# Patient Record
Sex: Male | Born: 1980 | Race: Black or African American | Hispanic: No | Marital: Married | Smoking: Former smoker
Health system: Southern US, Community
[De-identification: ages and names within clinical notes are randomized; demographics above are authoritative.]

## PROBLEM LIST (undated history)

## (undated) DIAGNOSIS — Z973 Presence of spectacles and contact lenses: Secondary | ICD-10-CM

## (undated) DIAGNOSIS — G4733 Obstructive sleep apnea (adult) (pediatric): Secondary | ICD-10-CM

## (undated) DIAGNOSIS — I471 Supraventricular tachycardia: Secondary | ICD-10-CM

## (undated) DIAGNOSIS — S83206A Unspecified tear of unspecified meniscus, current injury, right knee, initial encounter: Secondary | ICD-10-CM

## (undated) HISTORY — DX: Supraventricular tachycardia: I47.1

## (undated) HISTORY — PX: SUPRAVENTRICULAR TACHYCARDIA ABLATION: SHX6106

---

## 2007-01-28 ENCOUNTER — Emergency Department (HOSPITAL_COMMUNITY): Admission: EM | Admit: 2007-01-28 | Discharge: 2007-01-28 | Payer: Self-pay | Admitting: Emergency Medicine

## 2008-02-10 ENCOUNTER — Emergency Department (HOSPITAL_COMMUNITY): Admission: EM | Admit: 2008-02-10 | Discharge: 2008-02-11 | Payer: Self-pay | Admitting: Emergency Medicine

## 2008-09-13 ENCOUNTER — Emergency Department (HOSPITAL_COMMUNITY): Admission: EM | Admit: 2008-09-13 | Discharge: 2008-09-13 | Payer: Self-pay | Admitting: Emergency Medicine

## 2009-06-09 ENCOUNTER — Observation Stay (HOSPITAL_COMMUNITY): Admission: EM | Admit: 2009-06-09 | Discharge: 2009-06-09 | Payer: Self-pay | Admitting: Emergency Medicine

## 2010-02-10 ENCOUNTER — Emergency Department (HOSPITAL_COMMUNITY): Admission: EM | Admit: 2010-02-10 | Discharge: 2010-02-10 | Payer: Self-pay | Admitting: Family Medicine

## 2012-07-11 ENCOUNTER — Ambulatory Visit: Payer: Self-pay

## 2012-07-11 ENCOUNTER — Ambulatory Visit: Payer: BC Managed Care – PPO

## 2012-10-07 ENCOUNTER — Emergency Department (HOSPITAL_COMMUNITY)
Admission: EM | Admit: 2012-10-07 | Discharge: 2012-10-07 | Disposition: A | Discharge status: Home / Self Care | Payer: Managed Care, Other (non HMO) | Attending: Emergency Medicine | Admitting: Emergency Medicine

## 2012-10-07 ENCOUNTER — Emergency Department (HOSPITAL_COMMUNITY): Payer: Managed Care, Other (non HMO)

## 2012-10-07 ENCOUNTER — Encounter (HOSPITAL_COMMUNITY): Payer: Self-pay | Admitting: Emergency Medicine

## 2012-10-07 DIAGNOSIS — M1990 Unspecified osteoarthritis, unspecified site: Secondary | ICD-10-CM

## 2012-10-07 DIAGNOSIS — M47817 Spondylosis without myelopathy or radiculopathy, lumbosacral region: Secondary | ICD-10-CM | POA: Insufficient documentation

## 2012-10-07 DIAGNOSIS — G8929 Other chronic pain: Secondary | ICD-10-CM | POA: Insufficient documentation

## 2012-10-07 MED ORDER — PREDNISONE 10 MG PO TABS: 20.0000 mg | ORAL_TABLET | Freq: Every day | ORAL | Status: DC

## 2012-10-07 MED ORDER — HYDROMORPHONE HCL PF 1 MG/ML IJ SOLN
1.0000 mg | Freq: Once | INTRAMUSCULAR | Status: AC
  Administered 2012-10-07: 1 mg via INTRAVENOUS
  Filled 2012-10-07: qty 1

## 2012-10-07 MED ORDER — OXYCODONE-ACETAMINOPHEN 5-325 MG PO TABS: 2.0000 | ORAL_TABLET | ORAL | Status: DC | PRN

## 2012-10-07 MED ORDER — METHYLPREDNISOLONE SODIUM SUCC 125 MG IJ SOLR
125.0000 mg | Freq: Once | INTRAMUSCULAR | Status: AC
  Administered 2012-10-07: 125 mg via INTRAVENOUS
  Filled 2012-10-07: qty 2

## 2012-10-07 MED ORDER — DIAZEPAM 5 MG PO TABS
5.0000 mg | ORAL_TABLET | Freq: Once | ORAL | Status: AC
  Administered 2012-10-07: 5 mg via ORAL
  Filled 2012-10-07: qty 1

## 2012-10-07 MED ORDER — DIAZEPAM 5 MG PO TABS: 10.0000 mg | ORAL_TABLET | Freq: Four times a day (QID) | ORAL | Status: DC | PRN

## 2012-10-07 MED ORDER — SODIUM CHLORIDE 0.9 % IV SOLN
INTRAVENOUS | Status: DC
  Administered 2012-10-07: 13:00:00 via INTRAVENOUS

## 2012-10-07 MED ORDER — SODIUM CHLORIDE 0.9 % IV BOLUS (SEPSIS)
1000.0000 mL | Freq: Once | INTRAVENOUS | Status: AC
  Administered 2012-10-07: 1000 mL via INTRAVENOUS

## 2012-10-07 MED ORDER — HYDROMORPHONE HCL PF 1 MG/ML IJ SOLN
2.0000 mg | Freq: Once | INTRAMUSCULAR | Status: AC
  Administered 2012-10-07: 2 mg via INTRAVENOUS
  Filled 2012-10-07: qty 2

## 2012-10-07 NOTE — ED Provider Notes (Signed)
History     CSN: [id number]  Arrival date & time 10/07/12  0820   First MD Initiated Contact with Patient 10/07/12 [phone]      No chief complaint on file.   (Consider location/radiation/quality/duration/timing/severity/associated sxs/prior treatment) The history is provided by the patient.   Patient here with worsening severe lower back pain since last night after he bent over to pick up an object. Complains of sharp burning pain starts in the bones back into sinus left leg. Notes that when he was walking in here today he was dragging his left foot. Denies any loss of bowel or bladder function. History of chronic back pain the past. Has used over-the-counter medications without relief. No prior history of back surgery but has been diagnosed with degenerative joint disease No past medical history on file.  No past surgical history on file.  No family history on file.  History  Substance Use Topics  . Smoking status: Not on file  . Smokeless tobacco: Not on file  . Alcohol Use: Not on file      Review of Systems  All other systems reviewed and are negative.    Allergies  Review of patient's allergies indicates not on file.  Home Medications  No current outpatient prescriptions on file.  There were no vitals taken for this visit.  Physical Exam  Nursing note and vitals reviewed. Constitutional: He is oriented to person, place, and time. He appears well-developed and well-nourished.  Non-toxic appearance. No distress.  HENT:  Head: Normocephalic and atraumatic.  Eyes: Conjunctivae normal, EOM and lids are normal. Pupils are equal, round, and reactive to light.  Neck: Normal range of motion. Neck supple. No tracheal deviation present. No mass present.  Cardiovascular: Normal rate, regular rhythm and normal heart sounds.  Exam reveals no gallop.   No murmur heard. Pulmonary/Chest: Effort normal and breath sounds normal. No stridor. No respiratory distress. He has no  decreased breath sounds. He has no wheezes. He has no rhonchi. He has no rales.  Abdominal: Soft. Normal appearance and bowel sounds are normal. He exhibits no distension. There is no tenderness. There is no rebound and no CVA tenderness.  Musculoskeletal: Normal range of motion. He exhibits no edema and no tenderness.       Arms: Neurological: He is alert and oriented to person, place, and time. No cranial nerve deficit or sensory deficit. GCS eye subscore is 4. GCS verbal subscore is 5. GCS motor subscore is 6.       Patient unable to cooperate fully with straight leg raise due to pain. His patellar reflexes were 2+. Sensation was intact.  Skin: Skin is warm and dry. No abrasion and no rash noted.  Psychiatric: He has a normal mood and affect. His speech is normal and behavior is normal.    ED Course  Procedures (including critical care time)  Labs Reviewed - No data to display No results found.   No diagnosis found.    MDM  Patient's MRI results noted and without signs of acute surgical emergency. He was given indications here for pain and muscle spasms feels better. Will be prescribed for steroids, opiates, muscle accidents. Neurological exam is stable at time of discharge       Toy Baker, MD 10/07/12 1335

## 2012-10-07 NOTE — ED Notes (Signed)
Pt presenting to ed with c/o low back pain onset x 1 day pt denies injury at this time

## 2012-10-07 NOTE — ED Notes (Signed)
Patient transported to MRI 

## 2012-10-07 NOTE — ED Notes (Signed)
MD at bedside. 

## 2013-08-29 ENCOUNTER — Emergency Department (INDEPENDENT_AMBULATORY_CARE_PROVIDER_SITE_OTHER)
Admission: EM | Admit: 2013-08-29 | Discharge: 2013-08-29 | Disposition: A | Discharge status: Home / Self Care | Payer: Managed Care, Other (non HMO) | Source: Home / Self Care | Attending: Family Medicine | Admitting: Family Medicine

## 2013-08-29 ENCOUNTER — Encounter (HOSPITAL_COMMUNITY): Payer: Self-pay | Admitting: Emergency Medicine

## 2013-08-29 DIAGNOSIS — J020 Streptococcal pharyngitis: Secondary | ICD-10-CM

## 2013-08-29 MED ORDER — LIDOCAINE HCL (PF) 1 % IJ SOLN
INTRAMUSCULAR | Status: AC
  Filled 2013-08-29: qty 5

## 2013-08-29 MED ORDER — CEFDINIR 300 MG PO CAPS: 300.0000 mg | ORAL_CAPSULE | Freq: Two times a day (BID) | ORAL | Status: DC

## 2013-08-29 MED ORDER — CEFTRIAXONE SODIUM 1 G IJ SOLR
INTRAMUSCULAR | Status: AC
  Filled 2013-08-29: qty 10

## 2013-08-29 MED ORDER — CEFTRIAXONE SODIUM 1 G IJ SOLR
1.0000 g | Freq: Once | INTRAMUSCULAR | Status: AC
  Administered 2013-08-29: 1 g via INTRAMUSCULAR

## 2013-08-29 NOTE — ED Provider Notes (Signed)
CSN: [id number]     Arrival date & time 08/29/13  1923 History   First MD Initiated Contact with Patient 08/29/13 1936     Chief Complaint  Patient presents with  . Sore Throat   (Consider location/radiation/quality/duration/timing/severity/associated sxs/prior Treatment) Patient is a 32 y.o. male presenting with pharyngitis. The history is provided by the patient.  Sore Throat This is a new problem. The current episode started 2 days ago. The problem has been gradually worsening. Pertinent negatives include no chest pain and no abdominal pain. The symptoms are aggravated by swallowing.    History reviewed. No pertinent past medical history. History reviewed. No pertinent past surgical history. History reviewed. No pertinent family history. History  Substance Use Topics  . Smoking status: Current Some Day Smoker  . Smokeless tobacco: Not on file  . Alcohol Use: Yes     Comment: socially    Review of Systems  Constitutional: Positive for fever and appetite change.  HENT: Positive for sore throat. Negative for congestion, postnasal drip and rhinorrhea.   Respiratory: Negative for cough.   Cardiovascular: Negative for chest pain.  Gastrointestinal: Negative.  Negative for abdominal pain.  Skin: Negative.     Allergies  Review of patient's allergies indicates no known allergies.  Home Medications   Current Outpatient Rx  Name  Route  Sig  Dispense  Refill  . cefdinir (OMNICEF) 300 MG capsule   Oral   Take 1 capsule (300 mg total) by mouth 2 (two) times daily.   20 capsule   0   . diazepam (VALIUM) 5 MG tablet   Oral   Take 2 tablets (10 mg total) by mouth every 6 (six) hours as needed (muscle spasm).   20 tablet   0   . oxyCODONE-acetaminophen (PERCOCET/ROXICET) 5-325 MG per tablet   Oral   Take 2 tablets by mouth every 4 (four) hours as needed for pain.   20 tablet   0   . predniSONE (DELTASONE) 10 MG tablet   Oral   Take 2 tablets (20 mg total) by mouth  daily.   10 tablet   0    There were no vitals taken for this visit. Physical Exam  Nursing note and vitals reviewed. Constitutional: He is oriented to person, place, and time. He appears well-developed and well-nourished.  HENT:  Head: Normocephalic.  Right Ear: External ear normal.  Left Ear: External ear normal.  Mouth/Throat: Uvula is midline and mucous membranes are normal. Oropharyngeal exudate and posterior oropharyngeal erythema present.  Neck: Normal range of motion. Neck supple.  Cardiovascular: Regular rhythm.   Pulmonary/Chest: Breath sounds normal.  Lymphadenopathy:    He has cervical adenopathy.  Neurological: He is alert and oriented to person, place, and time.  Skin: Skin is warm and dry.    ED Course  Procedures (including critical care time) Labs Review Labs Reviewed  POCT RAPID STREP A ([facility] ONLY) - Abnormal; Notable for the following:    Streptococcus, Group A Screen (Direct) POSITIVE (*)    All other components within normal limits   Imaging Review No results found.  EKG Interpretation    Date/Time:    Ventricular Rate:    PR Interval:    QRS Duration:   QT Interval:    QTC Calculation:   R Axis:     Text Interpretation:              MDM  Strep pos.    Linna Hoff, MD 08/29/13  2022 

## 2013-08-29 NOTE — ED Notes (Signed)
C/o sore throat and swollen tonsils x 3 days.  No relief with otc throat spray.  Denies fever, n/v/d

## 2013-10-15 ENCOUNTER — Emergency Department (HOSPITAL_COMMUNITY)
Admission: EM | Admit: 2013-10-15 | Discharge: 2013-10-15 | Disposition: A | Discharge status: Home / Self Care | Payer: Managed Care, Other (non HMO) | Source: Home / Self Care | Attending: Emergency Medicine | Admitting: Emergency Medicine

## 2013-10-15 ENCOUNTER — Encounter (HOSPITAL_COMMUNITY): Payer: Self-pay | Admitting: Emergency Medicine

## 2013-10-15 DIAGNOSIS — H109 Unspecified conjunctivitis: Secondary | ICD-10-CM

## 2013-10-15 MED ORDER — POLYMYXIN B-TRIMETHOPRIM 10000-0.1 UNIT/ML-% OP SOLN: 1.0000 [drp] | OPHTHALMIC | Status: DC

## 2013-10-15 NOTE — Discharge Instructions (Signed)

## 2013-10-15 NOTE — ED Provider Notes (Signed)
  Chief Complaint   Chief Complaint  Patient presents with  . Eye Problem    History of Present Illness   [name] [name] is a 33 year old male who has had a one-day history of redness of the right. He denies any pain. It feels slightly irritated. There is no purulent drainage, there is copious tearing. His vision has been normal. He wears contact lenses and has them and right now. He denies any trauma to the eye or foreign body.  Review of Systems   Other than as noted above, the patient denies any of the following symptoms: Systemic:  No fever, chills, sweats, fatigue, or weight loss. Eye:  No redness, eye pain, photophobia, discharge, blurred vision, or diplopia. ENT:  No nasal congestion, rhinorrhea, or sore throat. Lymphatic:  No adenopathy. Skin:  No rash or pruritis.  PMFSH   Past medical history, family history, social history, meds, and allergies were reviewed.    Physical Examination    Vital signs:  BP 139/87  Pulse 81  Temp(Src) 99.6 F (37.6 C) (Oral)  Resp 16  SpO2 98%  Visual Acuity:  Right Eye Distance: 50 Left Eye Distance: 30 Bilateral Distance: 20  General:  Alert and in no distress. Eye:  Lids were normal. Conjunctiva was moderately injected. There was no purulent drainage. No foreign body. Cornea was intact to fluorescein staining. Anterior chamber was normal. PERRLA, full EOMs, fundi were benign. ENT:  TMs and canals clear.  Nasal mucosa normal.  No intra-oral lesions, mucous membranes moist, pharynx clear. Neck:  No adenopathy tenderness or mass. Skin:  Clear, warm and dry.  Assessment   The encounter diagnosis was Conjunctivitis.  Plan     1.  Meds:  The following meds were prescribed:   Discharge Medication List as of 10/15/2013  9:36 AM    START taking these medications   Details  trimethoprim-polymyxin b (POLYTRIM) ophthalmic solution Place 1 drop into the right eye every 4 (four) hours., Starting 10/15/2013, Until Discontinued, Normal        2.  Patient Education/Counseling:  The patient was given appropriate handouts, self care instructions, and instructed in symptomatic relief.  Advised moist warm compresses, avoidance of rubbing, and suggested he leave contacts out.  3.  Follow up:  The patient was told to follow up here if no better in 3 to 4 days, or sooner if becoming worse in any way, and given some red flag symptoms such as increasing pain or changes in vision which would prompt immediate return.  Follow up here as needed.      Reuben Likesavid C Helio Lack, MD 10/15/13 [phone]

## 2013-10-15 NOTE — ED Notes (Signed)
Pt  Reports  He  Woke    Up   With      A  Red      Irritated  r  Eye           He  denys  Any  Injury  He  denys  Any pain    He       denys  Any known  FB   In the  Affected  Eye

## 2013-10-18 ENCOUNTER — Telehealth (HOSPITAL_COMMUNITY): Payer: Self-pay | Admitting: Emergency Medicine

## 2013-10-18 NOTE — ED Notes (Signed)
Patient calls in today stating that his eyes are no better with Polytrim eyedrops. He's been on them now for 3 days. I called back phone number that he gave us: [phone]; he was not available on that number but I did leave a message with him. I told him that if his eye was still giving him problems, that the best thing for him to do would be to see an eye doctor. I gave him the name of Dr. Stephannie LiJason Sanders to call tomorrow morning. If he has any further problems he can call us back here.  Reuben Likesavid C Andrell Tallman, MD 10/18/13 [phone]

## 2013-10-27 ENCOUNTER — Telehealth (HOSPITAL_COMMUNITY): Payer: Self-pay | Admitting: *Deleted

## 2013-10-27 NOTE — ED Notes (Signed)
2/11 Pt. called on VM and said he has pink eye and was given a Rx.  States his eye is still red.  I can see a note that Dr. Lorenz CoasterKeller called him back on 2/11 and left him instructions on his VM to follow-up with opthamologist. 2/20 I called and left a message to see it pt. got the message and followed up. [name] [name], [name] [name] 10/27/2013

## 2014-06-21 ENCOUNTER — Emergency Department (INDEPENDENT_AMBULATORY_CARE_PROVIDER_SITE_OTHER)
Admission: EM | Admit: 2014-06-21 | Discharge: 2014-06-21 | Disposition: A | Discharge status: Home / Self Care | Payer: Managed Care, Other (non HMO) | Source: Home / Self Care | Attending: Family Medicine | Admitting: Family Medicine

## 2014-06-21 ENCOUNTER — Encounter (HOSPITAL_COMMUNITY): Payer: Self-pay | Admitting: Emergency Medicine

## 2014-06-21 DIAGNOSIS — K4090 Unilateral inguinal hernia, without obstruction or gangrene, not specified as recurrent: Secondary | ICD-10-CM

## 2014-06-21 NOTE — ED Provider Notes (Signed)
CSN: [id number]     Arrival date & time 06/21/14  0840 History   First MD Initiated Contact with Patient 06/21/14 [phone]     Chief Complaint  Patient presents with  . Hernia   (Consider location/radiation/quality/duration/timing/severity/associated sxs/prior Treatment) HPI   33 year old male presents complaining of possible inguinal hernia. For several months he has a bulge in his groin that is worse with any lifting. He is able to push the bulge back in. When this bulge is out, he has pain, but the pain goes away after he pushes it back in. No constipation, bowel movements are normal. No abdominal pain right now. He thinks it is a hernia and he is requesting referral to general surgery.  History reviewed. No pertinent past medical history. History reviewed. No pertinent past surgical history. History reviewed. No pertinent family history. History  Substance Use Topics  . Smoking status: Current Some Day Smoker  . Smokeless tobacco: Not on file  . Alcohol Use: Yes     Comment: socially    Review of Systems  Gastrointestinal: Positive for abdominal pain (see history of present illness).  All other systems reviewed and are negative.   Allergies  Review of patient's allergies indicates no known allergies.  Home Medications   Prior to Admission medications   Medication Sig Start Date End Date Taking? Authorizing Provider  cefdinir (OMNICEF) 300 MG capsule Take 1 capsule (300 mg total) by mouth 2 (two) times daily. 08/29/13   Linna HoffJames D Kindl, MD  diazepam (VALIUM) 5 MG tablet Take 2 tablets (10 mg total) by mouth every 6 (six) hours as needed (muscle spasm). 10/07/12   Toy BakerAnthony T Allen, MD  oxyCODONE-acetaminophen (PERCOCET/ROXICET) 5-325 MG per tablet Take 2 tablets by mouth every 4 (four) hours as needed for pain. 10/07/12   Toy BakerAnthony T Allen, MD  predniSONE (DELTASONE) 10 MG tablet Take 2 tablets (20 mg total) by mouth daily. 10/07/12   Toy BakerAnthony T Allen, MD  trimethoprim-polymyxin b  (POLYTRIM) ophthalmic solution Place 1 drop into the right eye every 4 (four) hours. 10/15/13   Reuben Likesavid C Keller, MD   BP 163/90  Pulse 75  Temp(Src) 98.7 F (37.1 C) (Oral)  Resp 16  SpO2 97% Physical Exam  Nursing note and vitals reviewed. Constitutional: He is oriented to person, place, and time. He appears well-developed and well-nourished. No distress.  HENT:  Head: Normocephalic.  Pulmonary/Chest: Effort normal. No respiratory distress.  Abdominal: Soft. Bowel sounds are normal. He exhibits no distension and no mass. There is no tenderness. There is no rebound and no guarding. A hernia is present. Hernia confirmed positive in the left inguinal area (Direct, easily reducible, nontender).  Neurological: He is alert and oriented to person, place, and time. Coordination normal.  Skin: Skin is warm and dry. No rash noted. He is not diaphoretic.  Psychiatric: He has a normal mood and affect. Judgment normal.    ED Course  Procedures (including critical care time) Labs Review Labs Reviewed - No data to display  Imaging Review No results found.   MDM   1. Direct inguinal hernia    Given that he has frequent pain from this hernia, he would likely benefit from repair. Referred to general surgery. Given  ER return precautions.    Graylon GoodZachary H Gaspard Isbell, PA-C 06/21/14 1014

## 2014-06-21 NOTE — ED Notes (Signed)
Pt  Reports  Pain   LLQ     X     sev    Months     Worse  Last  Pm    denys  Any  Recent      Injury  No  Vomiting          -

## 2014-06-21 NOTE — Discharge Instructions (Signed)

## 2014-07-07 NOTE — ED Provider Notes (Signed)
Medical screening examination/treatment/procedure(s) were performed by a resident physician or non-physician practitioner and as the supervising physician I was immediately available for consultation/collaboration.  Kveon Casanas, MD Family Medicine   Allyne Hebert J Destine Ambroise, MD 07/07/14 0344 

## 2015-10-22 ENCOUNTER — Emergency Department (HOSPITAL_COMMUNITY)
Admission: EM | Admit: 2015-10-22 | Discharge: 2015-10-22 | Disposition: A | Discharge status: Home / Self Care | Payer: Managed Care, Other (non HMO) | Attending: Emergency Medicine | Admitting: Emergency Medicine

## 2015-10-22 ENCOUNTER — Encounter (HOSPITAL_COMMUNITY): Payer: Self-pay

## 2015-10-22 DIAGNOSIS — Z87891 Personal history of nicotine dependence: Secondary | ICD-10-CM | POA: Diagnosis not present

## 2015-10-22 DIAGNOSIS — I471 Supraventricular tachycardia: Secondary | ICD-10-CM | POA: Insufficient documentation

## 2015-10-22 DIAGNOSIS — R002 Palpitations: Secondary | ICD-10-CM | POA: Diagnosis present

## 2015-10-22 MED ORDER — METOPROLOL TARTRATE 25 MG PO TABS
25.0000 mg | ORAL_TABLET | Freq: Once | ORAL | Status: AC
  Administered 2015-10-22: 25 mg via ORAL
  Filled 2015-10-22: qty 1

## 2015-10-22 MED ORDER — METOPROLOL TARTRATE 25 MG PO TABS: 25.0000 mg | ORAL_TABLET | Freq: Two times a day (BID) | ORAL | Status: DC

## 2015-10-22 NOTE — ED Notes (Signed)
Pt arrives EMS from work with C/o rapid heart rate 180 SVT with  then 12 mg of Adenosine. Convert to NSR.

## 2015-10-22 NOTE — Discharge Instructions (Signed)
Paroxysmal Supraventricular Tachycardia Paroxysmal supraventricular tachycardia (PSVT) is a type of abnormal heart rhythm. It causes your heart to beat very quickly and then suddenly stop beating so quickly. A normal heart rate is 60-100 beats per minute. During an episode of PSVT, your heart rate may be 150-250 beats per minute. This can make you feel light-headed and short of breath. An episode of PSVT can be frightening. It is usually not dangerous. The heart has four chambers. All chambers need to work together for the heart to beat effectively. A normal heartbeat usually starts in the right upper chamber of the heart (atrium) when an area (sinoatrial node) puts out an electrical signal that spreads to the other chambers. People with PSVT may have abnormal electrical pathways, or they may have other areas in the upper chambers that send out electrical signals. The result is a very rapid heartbeat. When your heart beats very quickly, it does not have time to fill completely with blood. When PSVT happens often or it lasts for long periods, it can lead to heart weakness and failure. Most people with PSVT do not have any other heart disease. CAUSES Abnormal electrical activity in the heart causes PSVT. It is not known why some people get PSVT and others do not. RISK FACTORS You may be more likely to have PSVT if:  You are 20-30 years old.  You are a woman. Other factors that may increase your chances of an attack include:  Stress.  Being tired.  Smoking.  Stimulant drugs.  Alcoholic drinks.  Caffeine.  Pregnancy. SIGNS AND SYMPTOMS A mild episode of PSVT may cause no symptoms. If you do have signs and symptoms, they may include:  A pounding heart.  Feeling of skipped heartbeats (palpitations).  Weakness.  Shortness of breath.  Tightness or pain in your chest.  Light-headedness.  Anxiety.  Dizziness.  Sweating.  Nausea.  A fainting spell. DIAGNOSIS Your health care  provider may suspect PSVT if you have symptoms that come and go. The health care provider will do a physical exam. If you are having an episode during the exam, the health care provider may be able to diagnose PSVT by listening to your heart and feeling your pulse. Tests may also be done, including:  An electrical study of your heart (electrocardiogram, or ECG).  A test in which you wear a portable ECG monitor all day (Holter monitor) or for several days (event monitor).  A test that involves taking an image of your heart using sound waves (echocardiogram) to rule out other causes of a fast heart rate. TREATMENT You may not need treatment if episodes of PSVT do not happen often or if they do not cause symptoms. If PSVT episodes do cause symptoms, your health care provider may first suggest trying a self-treatment called vagus nerve stimulation. The vagus nerve extends down from the brain. It regulates certain body functions. Stimulating this nerve can slow down the heart. Your health care provider can teach you ways to do this. You may need to try a few ways to find what works best for you. Options include:  Holding your breath and pushing, as though you are having a bowel movement.  Massaging an area on one side of your neck below your jaw.  Bending forward with your head between your legs.  Bending forward with your head between your legs and coughing.  Massaging your eyeballs with your eyes closed. If vagus nerve stimulation does not work, other treatment options include:    Medicines to prevent an attack.  Being treated in the hospital with medicine or electric shock to stop an attack (cardioversion). This treatment can include:  Getting medicine through an IV line.  Having a small electric shock delivered to your heart. You will be given medicine to make you sleep through this procedure.  If you have frequent episodes with symptoms, you may need a procedure to get rid of the faulty  areas of your heart (radiofrequency ablation) and end the episodes of PSVT. In this procedure:  A long, thin tube (catheter) is passed through one of your veins into your heart.  Energy directed through the catheter eliminates the areas of your heart that are causing abnormal electric stimulation. HOME CARE INSTRUCTIONS  Take medicines only as directed by your health care provider.  Do not use caffeine in any form if caffeine triggers episodes of PSVT. Otherwise, consume caffeine in moderation. This means no more than a few cups of coffee or the equivalent each day.  Do not drink alcohol if alcohol triggers episodes of PSVT. Otherwise, limit alcohol intake to no more than 1 drink per day for nonpregnant women and 2 drinks per day for men. One drink equals 12 ounces of beer, 5 ounces of wine, or 1 ounces of hard liquor.  Do not use any tobacco products, including cigarettes, chewing tobacco, or electronic cigarettes. If you need help quitting, ask your health care provider.  Try to get at least 7 hours of sleep each night.  Find healthy ways to manage stress.  Perform vagus nerve stimulation as directed by your health care provider.  Maintain a healthy weight.  Get some exercise on most days. Ask your health care provider to suggest some good activities for you. SEEK MEDICAL CARE IF:  You are having episodes of PSVT more often, or they are lasting longer.  Vagus nerve stimulation is no longer helping.  You have new symptoms during an episode. SEEK IMMEDIATE MEDICAL CARE IF:  You have chest pain or trouble breathing.  You have an episode of PSVT that has lasted longer than 20 minutes.  You have passed out from an episode of PSVT. These symptoms may represent a serious problem that is an emergency. Do not wait to see if the symptoms will go away. Get medical help right away. Call your local emergency services (911 in the [place] Do not drive yourself to the hospital.   This  information is not intended to replace advice given to you by your health care provider. Make sure you discuss any questions you have with your health care provider.   Document Released: 08/24/2005 Document Revised: 09/14/2014 Document Reviewed: 02/01/2014 Elsevier Interactive Patient Education 2016 [employer]  

## 2015-10-22 NOTE — ED Provider Notes (Signed)
CSN: [id number]     Arrival date & time 10/22/15  0908 History   First MD Initiated Contact with Patient 10/22/15 0913     Chief Complaint  Patient presents with  . Palpitations     HPI Patient presents to the emergency department via EMS.  Noted to have acute onset palpitations this morning.  Patient has a history of SVT.  He attempted to Valsalva to break himself.  He denies caffeine and chocolate intake.  He denies energy drinks.  He denies over-the-counter cough and cold medicine.  He denies illicit drug use.  Specifically denies cocaine.  He was given 6 mg of adenosine followed by 12 mg of adenosine with conversion to sinus rhythm.  Patient is asymptomatic at this time.  He states he was in his normal state of health earlier today.  Past Medical History  Diagnosis Date  . SVT (supraventricular tachycardia) (HCC)    History reviewed. No pertinent past surgical history. History reviewed. No pertinent family history. Social History  Substance Use Topics  . Smoking status: Former Games developer  . Smokeless tobacco: None  . Alcohol Use: No     Comment: socially    Review of Systems  All other systems reviewed and are negative.     Allergies  Review of patient's allergies indicates no known allergies.  Home Medications   Prior to Admission medications   Medication Sig Start Date End Date Taking? Authorizing Provider  metoprolol tartrate (LOPRESSOR) 25 MG tablet Take 1 tablet (25 mg total) by mouth 2 (two) times daily. 10/22/15   Azalia Bilis, MD   Pulse 85  Temp(Src) 98.4 F (36.9 C) (Oral)  Resp 16  Ht  (1.854 m)  Wt 275 lb (124.739 kg)  BMI 36.29 kg/m2  SpO2 98% Physical Exam  Constitutional: He is oriented to person, place, and time. He appears well-developed and well-nourished.  HENT:  Head: Normocephalic and atraumatic.  Eyes: EOM are normal.  Neck: Normal range of motion.  Cardiovascular: Normal rate, regular rhythm, normal heart sounds and intact distal  pulses.   Pulmonary/Chest: Effort normal and breath sounds normal. No respiratory distress.  Abdominal: Soft. He exhibits no distension. There is no tenderness.  Musculoskeletal: Normal range of motion.  Neurological: He is alert and oriented to person, place, and time.  Skin: Skin is warm and dry.  Psychiatric: He has a normal mood and affect. Judgment normal.  Nursing note and vitals reviewed.   ED Course  Procedures (including critical care time) Labs Review Labs Reviewed - No data to display  Imaging Review No results found. I have personally reviewed and evaluated these images and lab results as part of my medical decision-making.   EKG Interpretation   Date/Time:  Tuesday October 22 2015 09:17:12 EST Ventricular Rate:  86 PR Interval:  215 QRS Duration: 90 QT Interval:  346 QTC Calculation: 414 R Axis:   85 Text Interpretation:  Sinus rhythm Prolonged PR interval No old tracing to  compare Confirmed by Loghan Subia  MD, Caryn Bee ([id number]) on 10/22/2015 9:52:13 AM      MDM   Final diagnoses:  Palpitations  SVT (supraventricular tachycardia) (HCC)    Patient asymptomatic at this time.  Patient in normal sinus rhythm.  Patient be started on metoprolol.  Primary care and cardiology follow-up.  He understands return to the ER for new or worsening symptoms.    Azalia Bilis, MD 10/22/15 [phone]

## 2015-10-22 NOTE — ED Notes (Signed)
Pt Verbalizes understanding of instructions,

## 2016-01-01 DIAGNOSIS — I471 Supraventricular tachycardia, unspecified: Secondary | ICD-10-CM

## 2016-01-01 HISTORY — DX: Supraventricular tachycardia, unspecified: I47.10

## 2016-01-01 HISTORY — PX: CARDIAC ELECTROPHYSIOLOGY MAPPING AND ABLATION: SHX1292

## 2016-01-01 HISTORY — DX: Supraventricular tachycardia: I47.1

## 2016-03-12 ENCOUNTER — Ambulatory Visit (INDEPENDENT_AMBULATORY_CARE_PROVIDER_SITE_OTHER): Payer: Managed Care, Other (non HMO)

## 2016-03-12 ENCOUNTER — Ambulatory Visit (INDEPENDENT_AMBULATORY_CARE_PROVIDER_SITE_OTHER): Payer: Managed Care, Other (non HMO) | Admitting: Physician Assistant

## 2016-03-12 ENCOUNTER — Encounter: Payer: Self-pay | Admitting: Physician Assistant

## 2016-03-12 VITALS — BP 130/84 | HR 78 | Temp 99.1°F | Resp 16 | Ht 71.25 in | Wt 289.2 lb

## 2016-03-12 DIAGNOSIS — M25512 Pain in left shoulder: Secondary | ICD-10-CM

## 2016-03-12 DIAGNOSIS — K4090 Unilateral inguinal hernia, without obstruction or gangrene, not specified as recurrent: Secondary | ICD-10-CM | POA: Insufficient documentation

## 2016-03-12 DIAGNOSIS — Z6841 Body Mass Index (BMI) 40.0 and over, adult: Secondary | ICD-10-CM | POA: Insufficient documentation

## 2016-03-12 MED ORDER — MELOXICAM 15 MG PO TABS
15.0000 mg | ORAL_TABLET | Freq: Every day | ORAL | Status: DC
Start: 2016-03-12 — End: 2019-05-16

## 2016-03-12 NOTE — Progress Notes (Signed)
Patient ID: [name] [name], male     DOB: Jun 19, 1981, 35 y.o.    MRN: [id number]  PCP: No PCP Per Patient  Chief Complaint  Patient presents with  . right shoulder pain    x 2 wks    Subjective:    HPI  Presents for evaluation of RIGHT shoulder pain x 2 weeks.  Pain began without a specific inciting event. Worse with movement. Notes a "pop" sensation or sound when he rotates the arm. Pain with lifting and grasping. Unable to raise his arm to 90 degrees, due to the pain. "My whole arm feels funny when I get the pain." Denies tingling, chest pain, SOB, neck pain. No previous shoulder injury.   Prior to Admission medications   Medication Sig Start Date End Date Taking? Authorizing Provider  metoprolol tartrate (LOPRESSOR) 25 MG tablet Take 1 tablet (25 mg total) by mouth 2 (two) times daily. Patient not taking: Reported on 03/12/2016 10/22/15   Azalia BilisKevin Campos, MD     No Known Allergies   Patient Active Problem List   Diagnosis Date Noted  . Left inguinal hernia 03/12/2016  . BMI 40.0-44.9, adult (HCC) 03/12/2016     Family History  Problem Relation Age of Onset  . Diabetes Mother   . Diabetes Father   . Diabetes Brother      Social History   Social History  . Marital Status: Married    Spouse Name: [name] [name]  . Number of Children: 3  . Years of Education: 12+   Occupational History  . Paint Mixer     [name]   Social History Main Topics  . Smoking status: Former Games developermoker  . Smokeless tobacco: Never Used  . Alcohol Use: 0.6 oz/week    1 Standard drinks or equivalent per week     Comment: rarely  . Drug Use: No  . Sexual Activity:    Partners: Female   Other Topics Concern  . Not on file   Social History Narrative   Lives with his wife and their 3 daughters.           Review of Systems As above.      Objective:  Physical Exam  Constitutional: He is oriented to person, place, and time. He appears well-developed and  well-nourished. He is active and cooperative. No distress.  BP 130/84 mmHg  Pulse 78  Temp(Src) 99.1 F (37.3 C) (Oral)  Resp 16  Ht 5' 11.25" (1.81 m)  Wt 289 lb 3.2 oz (131.18 kg)  BMI 40.04 kg/m2  SpO2 97%   Eyes: Conjunctivae are normal.  Pulmonary/Chest: Effort normal.  Musculoskeletal:       Right shoulder: He exhibits decreased range of motion, tenderness (supraspinatus, subscapularis, infraspinatus) and pain. He exhibits no bony tenderness, no swelling, no effusion, no crepitus, no deformity, no laceration, no spasm, normal pulse and normal strength.       Left shoulder: Normal.       Right elbow: Normal.      Right wrist: Normal.  POSITIVE empty can sign  Neurological: He is alert and oriented to person, place, and time. No cranial nerve deficit or sensory deficit.  Strength of the RIGHT arm is limited by pain in the shoulder with testing maneuvers.  Psychiatric: He has a normal mood and affect. His speech is normal and behavior is normal.         Dg Shoulder Right  03/12/2016  CLINICAL DATA:  Shoulder pain for 2 weeks, exacerbated  by movement. EXAM: RIGHT SHOULDER - 2+ VIEW COMPARISON:  None. FINDINGS: There is no evidence of fracture or dislocation. There is no evidence of arthropathy or other focal bone abnormality. Soft tissues are unremarkable. IMPRESSION: Negative. Electronically Signed   By: Myles RosenthalJohn  [name] M.D.   On: 03/12/2016 17:25       Assessment & Plan:  1. Left shoulder pain Normal radiographs. Meloxicam and rest. If no significant improvement on 03/16/2016, contact me. I will plan to refer to orthopedics for additional evaluation and treatment of presumed rotator cuff injury. - DG Shoulder Right; Future - meloxicam (MOBIC) 15 MG tablet; Take 1 tablet (15 mg total) by mouth daily.  Dispense: 30 tablet; Refill: 1   [name] Brashelle S. Frances Ambrosino, PA-C Physician Assistant-Certified [facility]

## 2016-03-12 NOTE — Patient Instructions (Addendum)
   If you are not doing better by Monday call us and we will refer you to Ortho. Schedule a wellness visit with Chelle at your convince to bring the health wellness items up to date and assess your hernia.  IF you received an x-ray today, you will receive an invoice from [facility]. Please contact [facility] at [phone] with questions or concerns regarding your invoice.   IF you received labwork today, you will receive an invoice from [facility]. Please contact [facility] at [phone] with questions or concerns regarding your invoice.   Our billing staff will not be able to assist you with questions regarding bills from these companies.  You will be contacted with the lab results as soon as they are available. The fastest way to get your results is to activate your My Chart account. Instructions are located on the last page of this paperwork. If you have not heard from us regarding the results in 2 weeks, please contact this office.

## 2016-03-12 NOTE — Progress Notes (Signed)
Subjective:    Patient ID: [name] [name], male    DOB: 1981/08/31, 35 y.o.   MRN: [id number]  Chief Complaint  Patient presents with  . right shoulder pain    x 2 wks    HPI  Patient is a 35yo male who presents today with shoulder pain x 2 weeks.  Pain hurts at rest, exacerbated by movement.  States he can feel a pop with rotation and abduction.  Having difficultly lifting things with that arm due to pain.  States "My whole arm feels funny when I get the pain."  Has used ibuprofen, which relieves pain for about 3 hours, then it returns.  Denies numbness, tingling, SOB, trauma or inticing event; chest, neck or back pain; or similar shoulder pain previously.  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  Patient Active Problem List   Diagnosis Date Noted  . Left inguinal hernia 03/12/2016  . BMI 40.0-44.9, adult (HCC) 03/12/2016   Current Outpatient Prescriptions on File Prior to Visit  Medication Sig Dispense Refill  . metoprolol tartrate (LOPRESSOR) 25 MG tablet Take 1 tablet (25 mg total) by mouth 2 (two) times daily. (Patient not taking: Reported on 03/12/2016) 60 tablet 1   No current facility-administered medications on file prior to visit.   No Known Allergies    Objective:   Physical Exam  Constitutional: He is oriented to person, place, and time. He appears well-developed and well-nourished. No distress.  Neck: Normal range of motion. Neck supple. No tracheal deviation present. No thyromegaly present.  Cardiovascular: Normal rate, regular rhythm, normal heart sounds and intact distal pulses.   Pulses:      Radial pulses are 2+ on the right side, and 2+ on the left side.       Posterior tibial pulses are 2+ on the right side, and 2+ on the left side.  Pulmonary/Chest: Effort normal and breath sounds normal. No respiratory distress. He has no rales.    Musculoskeletal:       Right shoulder: He exhibits decreased range of motion, tenderness and pain. He exhibits no swelling, no effusion, no crepitus, no deformity, no laceration, no spasm and normal pulse.  Tenderness to palpation at coracoid process and posterior glenohumeral joint.   + empty can test Pain with passive and active ROM.    Lymphadenopathy:    He has no cervical adenopathy.  Neurological: He is alert and oriented to person, place, and time.  Skin: He is not diaphoretic.  Psychiatric: He has a normal mood and affect. His behavior is normal. Judgment and thought content normal.    Dg Shoulder Right  03/12/2016  CLINICAL DATA:  Shoulder pain for 2 weeks, exacerbated by movement. EXAM: RIGHT SHOULDER - 2+ VIEW COMPARISON:  None. FINDINGS: There is no evidence of fracture or dislocation. There is no evidence of arthropathy or other focal bone abnormality. Soft tissues are unremarkable. IMPRESSION: Negative. Electronically Signed   By: Myles RosenthalJohn  Stahl M.D.   On: 03/12/2016 17:25        Assessment & Plan:  1. Left shoulder pain - DG Shoulder Right; Future - meloxicam (MOBIC) 15 MG tablet; Take 1 tablet (15 mg total) by mouth daily.  Dispense: 30 tablet; Refill: 1  Imaging showed no acute findings, he likely has a rotator cuff injury.   Prescribed meloxicam to be taken daily to help decrease pain.  Instructed patient to take with food and not to take ibuprofen or naproxen with this medication.   Patient to contact office on Monday if pain has not improved with treatment and a referral to Ortho will be placed.  Recommended patient schedule a wellness visit with his PCP when able to bring the health wellness items up to date and assess his possible hernia.  Pranit Owensby P. Elora Wolter, PA-S

## 2016-03-13 ENCOUNTER — Telehealth: Payer: Self-pay

## 2016-03-13 DIAGNOSIS — M25512 Pain in left shoulder: Secondary | ICD-10-CM

## 2016-03-13 NOTE — Telephone Encounter (Signed)
Pt is stating that he shoulder pain and would like for chelle to go ahead and refer him to ortho like they talked about  Best number  [phone]

## 2016-03-13 NOTE — Telephone Encounter (Signed)
Referral for ortho placed.

## 2016-03-26 ENCOUNTER — Encounter: Payer: Self-pay | Admitting: Physician Assistant

## 2016-03-26 DIAGNOSIS — M25511 Pain in right shoulder: Secondary | ICD-10-CM | POA: Insufficient documentation

## 2016-03-30 ENCOUNTER — Other Ambulatory Visit: Payer: Self-pay | Admitting: Orthopedic Surgery

## 2016-03-30 DIAGNOSIS — M25511 Pain in right shoulder: Secondary | ICD-10-CM

## 2016-04-05 ENCOUNTER — Ambulatory Visit
Admission: RE | Admit: 2016-04-05 | Discharge: 2016-04-05 | Disposition: A | Discharge status: Home / Self Care | Payer: Managed Care, Other (non HMO) | Source: Ambulatory Visit | Attending: Orthopedic Surgery | Admitting: Orthopedic Surgery

## 2016-04-05 DIAGNOSIS — M25511 Pain in right shoulder: Secondary | ICD-10-CM

## 2016-04-14 MED FILL — OXYCODONE/APAP 5/325MG: 5-325 | 10 days supply | Qty: 60 | Fill #0

## 2016-04-15 HISTORY — PX: SHOULDER SURGERY: SHX246

## 2016-04-15 HISTORY — PX: SHOULDER ARTHROSCOPY W/ LABRAL REPAIR: SHX2399

## 2016-09-15 ENCOUNTER — Encounter: Payer: Self-pay | Admitting: Physician Assistant

## 2016-09-15 ENCOUNTER — Ambulatory Visit (INDEPENDENT_AMBULATORY_CARE_PROVIDER_SITE_OTHER): Payer: Managed Care, Other (non HMO) | Admitting: Physician Assistant

## 2016-09-15 VITALS — BP 150/92 | HR 100 | Temp 99.6°F | Resp 16 | Ht 71.5 in | Wt 296.0 lb

## 2016-09-15 DIAGNOSIS — R000 Tachycardia, unspecified: Secondary | ICD-10-CM

## 2016-09-15 DIAGNOSIS — Z833 Family history of diabetes mellitus: Secondary | ICD-10-CM

## 2016-09-15 DIAGNOSIS — Z0000 Encounter for general adult medical examination without abnormal findings: Secondary | ICD-10-CM | POA: Diagnosis not present

## 2016-09-15 DIAGNOSIS — K4090 Unilateral inguinal hernia, without obstruction or gangrene, not specified as recurrent: Secondary | ICD-10-CM

## 2016-09-15 DIAGNOSIS — Z6841 Body Mass Index (BMI) 40.0 and over, adult: Secondary | ICD-10-CM

## 2016-09-15 DIAGNOSIS — G4730 Sleep apnea, unspecified: Secondary | ICD-10-CM

## 2016-09-15 DIAGNOSIS — Z23 Encounter for immunization: Secondary | ICD-10-CM

## 2016-09-15 LAB — POCT URINALYSIS DIP (MANUAL ENTRY)
BILIRUBIN UA: NEGATIVE
GLUCOSE UA: NEGATIVE
Ketones, POC UA: NEGATIVE
LEUKOCYTES UA: NEGATIVE
NITRITE UA: NEGATIVE
Protein Ur, POC: NEGATIVE
Spec Grav, UA: >=1.03
UROBILINOGEN UA: 0.2
pH, UA: 5.5

## 2016-09-15 NOTE — Progress Notes (Signed)
PCP: Porfirio Oar, PA-C  Subjective:    Patient ID: [name] [name], male    DOB: 02-08-1981, 36 y.o.   MRN: [id number]  HPI Presents for Annual Wellness exam. He is accompanied by his wife, also my patient, as is his mother-in-law and her wife.  He has a large inguinal hernia, soft, easily reducible. He would like referral to surgery for repair. He notes that it bulges more easily now that in the past. He has some pain when it protrudes, but it always resolves with reduction.  S/P ablation for PSVT. Doesn't like the way the metoprolol made him feel. "Loopy. Like I was dragging. Like I had no energy."  Reports loud snoring. His wife notes some pauses in breathing. Daytime fatigue despite going to bed early most nights.   Colorectal Cancer Screening: not yet a candidate Prostate Cancer Screening: not yet. No family history of prostate cancer. Bone Density Testing: not yet a candidate. HIV Screening: not yet. STI Screening: very low risk. Seasonal Influenza Vaccination: declined Td/Tdap Vaccination: today Pneumococcal Vaccination: not yet a candidate Zoster Vaccination: not yet a candidate Frequency of Dental evaluation: Q6 months Frequency of Eye evaluation: annually   Prior to Admission medications   Medication Sig Start Date End Date Taking? Authorizing Provider  meloxicam (MOBIC) 15 MG tablet Take 1 tablet (15 mg total) by mouth daily. Patient not taking: Reported on 09/15/2016 03/12/16  NO Garlan Drewes, PA-C  metoprolol tartrate (LOPRESSOR) 25 MG tablet Take 1 tablet (25 mg total) by mouth 2 (two) times daily. Patient not taking: Reported on 09/15/2016 10/22/15  NO Azalia Bilis, MD    No Known Allergies   Review of Systems  Constitutional: Positive for fatigue (and daytime somnolence).  HENT: Negative.   Eyes: Negative.   Respiratory: Negative.   Cardiovascular: Negative.   Gastrointestinal: Negative.   Endocrine: Negative.   Genitourinary: Positive for scrotal  swelling (LEFT inguinal hernia is worsening).  Musculoskeletal: Negative.   Skin: Negative.   Allergic/Immunologic: Positive for environmental allergies.  Neurological: Negative.   Hematological: Negative.   Psychiatric/Behavioral: Positive for sleep disturbance (snores, sleeps with neck hyerextended, possible apnea episodes).   Patient Active Problem List   Diagnosis Date Noted  . Right anterior shoulder pain 03/26/2016  . Left inguinal hernia 03/12/2016  . BMI 40.0-44.9, adult (HCC) 03/12/2016    Past Surgical History:  Procedure Laterality Date  . SHOULDER SURGERY      Family History  Problem Relation Age of Onset  . Diabetes Mother   . Diabetes Father   . Diabetes Brother    Social History   Social History  . Marital status: Married    Spouse name: Laine Giovanetti  . Number of children: 3  . Years of education: 12+   Occupational History  . Paint Mixer     [name]   Social History Main Topics  . Smoking status: Former Games developer  . Smokeless tobacco: Never Used  . Alcohol use 0.6 oz/week    1 Standard drinks or equivalent per week     Comment: rarely  . Drug use: No  . Sexual activity: Yes    Partners: Female   Other Topics Concern  . Not on file   Social History Narrative   Lives with his wife and their 3 daughters.          Objective:   Physical Exam  Constitutional: He is oriented to person, place, and time. He appears well-developed and well-nourished. He is active and  cooperative.  Non-toxic appearance. He does not have a sickly appearance. He does not appear ill. No distress.  BP (!) 150/92 (BP Location: Left Arm, Cuff Size: Large)   Pulse 100   Temp 99.6 F (37.6 C) (Oral)   Resp 16   Ht 5' 11.5" (1.816 m)   Wt 296 lb (134.3 kg)   SpO2 96%   BMI 40.71 kg/m    HENT:  Head: Normocephalic and atraumatic.  Right Ear: Hearing, tympanic membrane, external ear and ear canal normal.  Left Ear: Hearing, tympanic membrane, external ear  and ear canal normal.  Nose: Nose normal.  Mouth/Throat: Uvula is midline, oropharynx is clear and moist and mucous membranes are normal. He does not have dentures. No oral lesions. No trismus in the jaw. Normal dentition. No dental abscesses, uvula swelling, lacerations or dental caries.  Eyes: Conjunctivae, EOM and lids are normal. Pupils are equal, round, and reactive to light. Right eye exhibits no discharge. Left eye exhibits no discharge. No scleral icterus.  Fundoscopic exam:      The right eye shows no arteriolar narrowing, no AV nicking, no exudate, no hemorrhage and no papilledema.       The left eye shows no arteriolar narrowing, no AV nicking, no exudate, no hemorrhage and no papilledema.  Neck: Normal range of motion, full passive range of motion without pain and phonation normal. Neck supple. No spinous process tenderness and no muscular tenderness present. No neck rigidity. No tracheal deviation, no edema, no erythema and normal range of motion present. No thyromegaly present.  Cardiovascular: Normal rate, regular rhythm, S1 normal, S2 normal, normal heart sounds, intact distal pulses and normal pulses.  Exam reveals no gallop and no friction rub.   No murmur heard. Pulmonary/Chest: Effort normal and breath sounds normal. No respiratory distress. He has no wheezes. He has no rales.  Abdominal: Soft. Normal appearance and bowel sounds are normal. He exhibits no distension and no mass. There is no hepatosplenomegaly. There is no tenderness. There is no rebound and no guarding. No hernia.  Musculoskeletal: Normal range of motion. He exhibits no edema or tenderness.       Cervical back: Normal. He exhibits normal range of motion, no tenderness, no bony tenderness, no swelling, no edema, no deformity, no laceration, no pain, no spasm and normal pulse.       Thoracic back: Normal.       Lumbar back: Normal.  Lymphadenopathy:       Head (right side): No submental, no submandibular, no  tonsillar, no preauricular, no posterior auricular and no occipital adenopathy present.       Head (left side): No submental, no submandibular, no tonsillar, no preauricular, no posterior auricular and no occipital adenopathy present.    He has no cervical adenopathy.       Right: No supraclavicular adenopathy present.       Left: No supraclavicular adenopathy present.  Neurological: He is alert and oriented to person, place, and time. He has normal strength and normal reflexes. He displays no tremor. No cranial nerve deficit. He exhibits normal muscle tone. Coordination and gait normal.  Skin: Skin is warm, dry and intact. No abrasion, no ecchymosis, no laceration, no lesion and no rash noted. He is not diaphoretic. No cyanosis or erythema. No pallor. Nails show no clubbing.  Psychiatric: He has a normal mood and affect. His speech is normal and behavior is normal. Judgment and thought content normal. Cognition and memory are normal.  Assessment & Plan:  1. Annual physical exam Age appropriate anticipatory guidance provided.  2. Need for Tdap vaccination - Tdap vaccine greater than or equal to 7yo IM  3. Left inguinal hernia Refer to general surgery at his convenience. He declines referral now, stating that he needs to save up time to take off from work. Counseled that if he has pain that does not resolve with reduction, or if he is unable to reduce it, he needs re-evaluation.  4. BMI 40.0-44.9, adult (HCC) - Comprehensive metabolic panel - Lipid panel  5. Tachycardia And uncontrolled BP. Await lab results. Proceed with sleep study.  - CBC with Differential/Platelet - Comprehensive metabolic panel - TSH - POCT urinalysis dipstick - Urine Microscopic  6. Sleep apnea, unspecified type - Ambulatory referral to Sleep Studies  7. Family history of diabetes mellitus - Hemoglobin A1c   Fernande Bras, PA-C Physician Assistant-Certified [facility]

## 2016-09-15 NOTE — Patient Instructions (Addendum)
IF you received an x-ray today, you will receive an invoice from [facility]. Please contact [facility] at [phone] with questions or concerns regarding your invoice.   IF you received labwork today, you will receive an invoice from [facility]. Please contact [facility] at [phone] with questions or concerns regarding your invoice.   Our billing staff will not be able to assist you with questions regarding bills from these companies.  You will be contacted with the lab results as soon as they are available. The fastest way to get your results is to activate your My Chart account. Instructions are located on the last page of this paperwork. If you have not heard from [place] regarding the results in 2 weeks, please contact this office.    Keeping you healthy  Get these tests  Blood pressure- Have your blood pressure checked once a year by your healthcare provider.  Normal blood pressure is 120/80.  Weight- Have your body mass index (BMI) calculated to screen for obesity.  BMI is a measure of body fat based on height and weight. You can also calculate your own BMI at GravelBags.it.  Cholesterol- Have your cholesterol checked regularly starting at age 21, sooner may be necessary if you have diabetes, high blood pressure, if a family member developed heart diseases at an early age or if you smoke.   Chlamydia, HIV, and other sexual transmitted disease- Get screened each year until the age of 65 then within three months of each new sexual partner.  Diabetes- Have your blood sugar checked regularly if you have high blood pressure, high cholesterol, a family history of diabetes or if you are overweight.  Get these vaccines  Flu shot- Every fall.  Tetanus shot- Every 10 years.  Menactra- Single dose; prevents meningitis.  Take these steps  Don't smoke- If you do smoke, ask your healthcare provider about quitting. For tips on how to quit, go to  www.smokefree.gov or call 1-800-QUIT-NOW.  Be physically active- Exercise 5 days a week for at least 30 minutes.  If you are not already physically active start slow and gradually work up to 30 minutes of moderate physical activity.  Examples of moderate activity include walking briskly, mowing the yard, dancing, swimming bicycling, etc.  Eat a healthy diet- Eat a variety of healthy foods such as fruits, vegetables, low fat milk, low fat cheese, yogurt, lean meats, poultry, fish, beans, tofu, etc.  For more information on healthy eating, go to www.thenutritionsource.org  Drink alcohol in moderation- Limit alcohol intake two drinks or less a day.  Never drink and drive.  Dentist- Brush and floss teeth twice daily; visit your dentis twice a year.  Depression-Your emotional health is as important as your physical health.  If you're feeling down, losing interest in things you normally enjoy please talk with your healthcare provider.  Gun Safety- If you keep a gun in your home, keep it unloaded and with the safety lock on.  Bullets should be stored separately.  Helmet use- Always wear a helmet when riding a motorcycle, bicycle, rollerblading or skateboarding.  Safe sex- If you may be exposed to a sexually transmitted infection, use a condom  Seat belts- Seat bels can save your life; always wear one.  Smoke/Carbon Monoxide detectors- These detectors need to be installed on the appropriate level of your home.  Replace batteries at least once a year.  Skin Cancer- When out in the sun, cover up and use sunscreen SPF 15 or higher.  Violence- If anyone is threatening or hurting you, please tell your healthcare provider.    Tdap Vaccine (Tetanus, Diphtheria and Pertussis): What You Need to Know 1. Why get vaccinated? Tetanus, diphtheria and pertussis are very serious diseases. Tdap vaccine can protect [place] from these diseases. And, Tdap vaccine given to pregnant women can protect newborn babies  against pertussis. TETANUS (Lockjaw) is rare in the [place] today. It causes painful muscle tightening and stiffness, usually all over the body.  It can lead to tightening of muscles in the head and neck so you can't open your mouth, swallow, or sometimes even breathe. Tetanus kills about 1 out of 10 people who are infected even after receiving the best medical care. DIPHTHERIA is also rare in the [place] today. It can cause a thick coating to form in the back of the throat.  It can lead to breathing problems, heart failure, paralysis, and death. PERTUSSIS (Whooping Cough) causes severe coughing spells, which can cause difficulty breathing, vomiting and disturbed sleep.  It can also lead to weight loss, incontinence, and rib fractures. Up to 2 in 100 adolescents and 5 in 100 adults with pertussis are hospitalized or have complications, which could include pneumonia or death. These diseases are caused by bacteria. Diphtheria and pertussis are spread from person to person through secretions from coughing or sneezing. Tetanus enters the body through cuts, scratches, or wounds. Before vaccines, as many as 200,000 cases of diphtheria, 200,000 cases of pertussis, and hundreds of cases of tetanus, were reported in the [place] each year. Since vaccination began, reports of cases for tetanus and diphtheria have dropped by about 99% and for pertussis by about 80%. 2. Tdap vaccine Tdap vaccine can protect adolescents and adults from tetanus, diphtheria, and pertussis. One dose of Tdap is routinely given at age 55 or 66. People who did not get Tdap at that age should get it as soon as possible. Tdap is especially important for healthcare professionals and anyone having close contact with a baby younger than 12 months. Pregnant women should get a dose of Tdap during every pregnancy, to protect the newborn from pertussis. Infants are most at risk for severe, life-threatening complications from  pertussis. Another vaccine, called Td, protects against tetanus and diphtheria, but not pertussis. A Td booster should be given every 10 years. Tdap may be given as one of these boosters if you have never gotten Tdap before. Tdap may also be given after a severe cut or burn to prevent tetanus infection. Your doctor or the person giving you the vaccine can give you more information. Tdap may safely be given at the same time as other vaccines. 3. Some people should not get this vaccine  A person who has ever had a life-threatening allergic reaction after a previous dose of any diphtheria, tetanus or pertussis containing vaccine, OR has a severe allergy to any part of this vaccine, should not get Tdap vaccine. Tell the person giving the vaccine about any severe allergies.  Anyone who had coma or long repeated seizures within 7 days after a childhood dose of DTP or DTaP, or a previous dose of Tdap, should not get Tdap, unless a cause other than the vaccine was found. They can still get Td.  Talk to your doctor if you:  have seizures or another nervous system problem,  had severe pain or swelling after any vaccine containing diphtheria, tetanus or pertussis,  ever had a condition called Guillain-Barr Syndrome (GBS),  aren't feeling  well on the day the shot is scheduled. 4. Risks With any medicine, including vaccines, there is a chance of side effects. These are usually mild and go away on their own. Serious reactions are also possible but are rare. Most people who get Tdap vaccine do not have any problems with it. Mild problems following Tdap: (Did not interfere with activities)  Pain where the shot was given (about 3 in 4 adolescents or 2 in 3 adults)  Redness or swelling where the shot was given (about 1 person in 5)  Mild fever of at least 100.44F (up to about 1 in 25 adolescents or 1 in 100 adults)  Headache (about 3 or 4 people in 10)  Tiredness (about 1 person in 3 or 4)  Nausea,  vomiting, diarrhea, stomach ache (up to 1 in 4 adolescents or 1 in 10 adults)  Chills, sore joints (about 1 person in 10)  Body aches (about 1 person in 3 or 4)  Rash, swollen glands (uncommon) Moderate problems following Tdap: (Interfered with activities, but did not require medical attention)  Pain where the shot was given (up to 1 in 5 or 6)  Redness or swelling where the shot was given (up to about 1 in 16 adolescents or 1 in 12 adults)  Fever over 102F (about 1 in 100 adolescents or 1 in 250 adults)  Headache (about 1 in 7 adolescents or 1 in 10 adults)  Nausea, vomiting, diarrhea, stomach ache (up to 1 or 3 people in 100)  Swelling of the entire arm where the shot was given (up to about 1 in 500). Severe problems following Tdap: (Unable to perform usual activities; required medical attention)  Swelling, severe pain, bleeding and redness in the arm where the shot was given (rare). Problems that could happen after any vaccine:  People sometimes faint after a medical procedure, including vaccination. Sitting or lying down for about 15 minutes can help prevent fainting, and injuries caused by a fall. Tell your doctor if you feel dizzy, or have vision changes or ringing in the ears.  Some people get severe pain in the shoulder and have difficulty moving the arm where a shot was given. This happens very rarely.  Any medication can cause a severe allergic reaction. Such reactions from a vaccine are very rare, estimated at fewer than 1 in a million doses, and would happen within a few minutes to a few hours after the vaccination. As with any medicine, there is a very remote chance of a vaccine causing a serious injury or death. The safety of vaccines is always being monitored. For more information, visit: http://floyd.org/www.cdc.gov/vaccinesafety/ 5. What if there is a serious problem? What should I look for? Look for anything that concerns you, such as signs of a severe allergic reaction, very  high fever, or unusual behavior. Signs of a severe allergic reaction can include hives, swelling of the face and throat, difficulty breathing, a fast heartbeat, dizziness, and weakness. These would usually start a few minutes to a few hours after the vaccination. What should I do?  If you think it is a severe allergic reaction or other emergency that can't wait, call 9-1-1 or get the person to the nearest hospital. Otherwise, call your doctor.  Afterward, the reaction should be reported to the Vaccine Adverse Event Reporting System (VAERS). Your doctor might file this report, or you can do it yourself through the VAERS web site at www.vaers.LAgents.nohhs.gov, or by calling 1-908-863-6598.  VAERS does not give  medical advice. 6. The National Vaccine Injury Compensation Program The [employer] Vaccine Injury Compensation Program (VICP) is a federal program that was created to compensate people who may have been injured by certain vaccines. Persons who believe they may have been injured by a vaccine can learn about the program and about filing a claim by calling 1-(423) 701-2444 or visiting the VICP website at SpiritualWord.at. There is a time limit to file a claim for compensation. 7. How can I learn more?  Ask your doctor. He or she can give you the vaccine package insert or suggest other sources of information.  Call your local or state health department.  Contact the Centers for Disease Control and Prevention (CDC):  Call 608 266 9315 (1-800-CDC-INFO) or  Visit CDC's website at PicCapture.uy CDC Tdap Vaccine VIS (10/31/13) This information is not intended to replace advice given to you by your health care provider. Make sure you discuss any questions you have with your health care provider. Document Released: 02/23/2012 Document Revised: 05/14/2016 Document Reviewed: 05/14/2016 Elsevier Interactive Patient Education  2017 [employer].

## 2016-09-15 NOTE — Progress Notes (Signed)
Patient ID: [name] [name], male    DOB: 19-Sep-1980, 36 y.o.   MRN: [id number]  PCP: [name] Oarhelle Jeffery, PA-C  Chief Complaint  Patient presents with  . Annual Exam    Subjective:   Presents for [name] [name].  Pt is a 36yo AA male who presents for an annual wellness exam. He feels well today, but would like to have a referral to surgery to repair a left inguinal hernia. He states that the hernia has grown in size over the last year and protrudes with minimal bearing down, but reduces easily. He denies pain or erythema. He denies abdominal pain, nausea, vomiting, diarrhea, or constipation. He states that he wants to wait until later this year to do the surgery because he needs to save up time to take off at work. He also requests a referral for a sleep study to evaluate him for sleep apnea. His wife states that he snores and seems to have interrupted breathing episodes. She also states that he seems tired throughout the day and goes to bed early most nights.   He states that he has a headache today, but doesn't get them regularly and that they are always responsive to OTC Tylenol. He attributes his tachycardia to this headache. He states that he his rate has been pretty slow since his ablation.   Colorectal Cancer Screening: No risk factors or family history. HIV Screening: Pt declined. Monogamous.  STI Screening: Pt declined. Monogamous. Seasonal Influenza Vaccination: Pt declined Td/Tdap Vaccination: Will give today Frequency of Dental evaluation: Q6 months Frequency of Eye evaluation: Every year.    Patient Active Problem List   Diagnosis Date Noted  . Right anterior shoulder pain 03/26/2016  . Left inguinal hernia 03/12/2016  . BMI 40.0-44.9, adult (HCC) 03/12/2016    Past Medical History:  Diagnosis Date  . Paroxysmal supraventricular tachycardia (HCC) 01/01/2016   s/p ablation     Prior to Admission medications   Medication Sig Start Date End Date Taking?  Authorizing Provider  meloxicam (MOBIC) 15 MG tablet Take 1 tablet (15 mg total) by mouth daily. Patient not taking: Reported on 09/15/2016 03/12/16   [name] Oarhelle Jeffery, PA-C  metoprolol tartrate (LOPRESSOR) 25 MG tablet Take 1 tablet (25 mg total) by mouth 2 (two) times daily. Patient not taking: Reported on 09/15/2016 10/22/15   [name] BilisKevin Campos, MD    No Known Allergies  Past Surgical History:  Procedure Laterality Date  . SHOULDER SURGERY    . SHOULDER SURGERY  2017    Family History  Problem Relation Age of Onset  . Diabetes Mother   . Diabetes Father   . Diabetes Brother     Social History   Social History  . Marital status: Married    Spouse name: [name] [name]  . Number of children: 3  . Years of education: 12+   Occupational History  . Paint Mixer     [name] [name]   Social History Main Topics  . Smoking status: Former Games developermoker  . Smokeless tobacco: Never Used  . Alcohol use 0.6 oz/week    1 Standard drinks or equivalent per week     Comment: rarely  . Drug use: No  . Sexual activity: Yes    Partners: Female   Other Topics Concern  . None   Social History Narrative   Lives with his wife and their 3 daughters.          Review of Systems In addition to that mentioned in the  HPI above: Cont: Denies fever, chills,or weight changes. HEENT: Denies changes in vision. Pulm: Denies  CV: Denies chest pain or palpitations.      Objective:  Physical Exam         Assessment & Plan:  1. Annual physical exam  2. Need for Tdap vaccination - Tdap vaccine greater than or equal to 7yo IM  3. Left inguinal hernia Pt will notify provider when he wants the surgical referral.   4. BMI 40.0-44.9, adult (HCC) - Comprehensive metabolic panel - Lipid panel  5. Tachycardia Decreased to 100 bpm by end of [name]. - CBC with Differential/Platelet - Comprehensive metabolic panel - TSH - POCT urinalysis dipstick - Urine Microscopic  6. Sleep apnea, unspecified  type - Ambulatory referral to Sleep Studies  7. Family history of diabetes mellitus - Hemoglobin A1c   [name] Spinner, PA-S

## 2016-09-16 LAB — COMPREHENSIVE METABOLIC PANEL
ALBUMIN: 4.5 g/dL (ref 3.5–5.5)
ALK PHOS: 73 IU/L (ref 39–117)
ALT: 41 IU/L (ref 0–44)
AST: 21 IU/L (ref 0–40)
Albumin/Globulin Ratio: 1.6 (ref 1.2–2.2)
BUN / CREAT RATIO: 12 (ref 9–20)
BUN: 9 mg/dL (ref 6–20)
Bilirubin Total: 0.5 mg/dL (ref 0.0–1.2)
CALCIUM: 9.8 mg/dL (ref 8.7–10.2)
CO2: 22 mmol/L (ref 18–29)
CREATININE: 0.76 mg/dL (ref 0.76–1.27)
Chloride: 105 mmol/L (ref 96–106)
GFR, EST AFRICAN AMERICAN: 137 mL/min/{1.73_m2} (ref >59)
GFR, EST NON AFRICAN AMERICAN: 118 mL/min/{1.73_m2} (ref >59)
GLOBULIN, TOTAL: 2.9 g/dL (ref 1.5–4.5)
Glucose: 73 mg/dL (ref 65–99)
Potassium: 4.2 mmol/L (ref 3.5–5.2)
SODIUM: 144 mmol/L (ref 134–144)
TOTAL PROTEIN: 7.4 g/dL (ref 6.0–8.5)

## 2016-09-16 LAB — CBC WITH DIFFERENTIAL/PLATELET
Basophils Absolute: 0 10*3/uL (ref 0.0–0.2)
Basos: 0 %
EOS (ABSOLUTE): 0.2 10*3/uL (ref 0.0–0.4)
EOS: 3 %
HEMATOCRIT: 39.6 % (ref 37.5–51.0)
HEMOGLOBIN: 13.4 g/dL (ref 13.0–17.7)
IMMATURE GRANS (ABS): 0 10*3/uL (ref 0.0–0.1)
Immature Granulocytes: 0 %
LYMPHS ABS: 2.7 10*3/uL (ref 0.7–3.1)
LYMPHS: 34 %
MCH: 27.5 pg (ref 26.6–33.0)
MCHC: 33.8 g/dL (ref 31.5–35.7)
MCV: 81 fL (ref 79–97)
MONOCYTES: 5 %
Monocytes Absolute: 0.4 10*3/uL (ref 0.1–0.9)
Neutrophils Absolute: 4.5 10*3/uL (ref 1.4–7.0)
Neutrophils: 58 %
Platelets: 298 10*3/uL (ref 150–379)
RBC: 4.88 x10E6/uL (ref 4.14–5.80)
RDW: 15.4 % (ref 12.3–15.4)
WBC: 7.9 10*3/uL (ref 3.4–10.8)

## 2016-09-16 LAB — URINALYSIS, MICROSCOPIC ONLY
Casts: NONE SEEN /lpf
Epithelial Cells (non renal): NONE SEEN /hpf (ref 0–10)

## 2016-09-16 LAB — LIPID PANEL
CHOL/HDL RATIO: 4 ratio (ref 0.0–5.0)
Cholesterol, Total: 203 mg/dL — ABNORMAL HIGH (ref 100–199)
HDL: 51 mg/dL (ref >39)
LDL CALC: 136 mg/dL — AB (ref 0–99)
Triglycerides: 78 mg/dL (ref 0–149)
VLDL CHOLESTEROL CAL: 16 mg/dL (ref 5–40)

## 2016-09-16 LAB — TSH: TSH: 1.27 u[IU]/mL (ref 0.450–4.500)

## 2016-09-16 LAB — HEMOGLOBIN A1C
ESTIMATED AVERAGE GLUCOSE: 128 mg/dL
Hgb A1c MFr Bld: 6.1 % — ABNORMAL HIGH (ref 4.8–5.6)

## 2016-09-22 ENCOUNTER — Telehealth: Payer: Self-pay | Admitting: Family Medicine

## 2016-09-22 NOTE — Telephone Encounter (Signed)
PT CALLED ABOUT LABS I GAVE HIM MESSAGE HE UNDERSTANDS AND SENT COPY IN MAIL

## 2016-09-26 ENCOUNTER — Encounter: Payer: Self-pay | Admitting: *Deleted

## 2016-10-07 ENCOUNTER — Institutional Professional Consult (permissible substitution): Payer: Managed Care, Other (non HMO) | Admitting: Neurology

## 2016-10-26 ENCOUNTER — Telehealth: Payer: Self-pay

## 2016-10-26 NOTE — Telephone Encounter (Signed)
I called [name] [name] to r/s his appt with Dr. Frances FurbishAthar on 10/28/2016 since she will be out of the office.   No answer, left a message asking him to call me back. If [name] [name] calls back, please reschedule him with Dr. Frances FurbishAthar for his sleep consult.

## 2016-10-28 ENCOUNTER — Institutional Professional Consult (permissible substitution): Payer: Managed Care, Other (non HMO) | Admitting: Neurology

## 2016-10-29 ENCOUNTER — Encounter (INDEPENDENT_AMBULATORY_CARE_PROVIDER_SITE_OTHER): Payer: Managed Care, Other (non HMO) | Admitting: Family Medicine

## 2016-11-10 ENCOUNTER — Ambulatory Visit (INDEPENDENT_AMBULATORY_CARE_PROVIDER_SITE_OTHER): Payer: Managed Care, Other (non HMO) | Admitting: Family Medicine

## 2016-11-17 ENCOUNTER — Institutional Professional Consult (permissible substitution): Payer: Managed Care, Other (non HMO) | Admitting: Neurology

## 2016-11-17 ENCOUNTER — Telehealth: Payer: Self-pay

## 2016-11-17 NOTE — Telephone Encounter (Signed)
Patient did not show to appt today  

## 2016-11-23 ENCOUNTER — Encounter: Payer: Self-pay | Admitting: Neurology

## 2016-12-30 ENCOUNTER — Institutional Professional Consult (permissible substitution): Payer: Managed Care, Other (non HMO) | Admitting: Neurology

## 2017-01-26 ENCOUNTER — Ambulatory Visit (INDEPENDENT_AMBULATORY_CARE_PROVIDER_SITE_OTHER): Payer: 59 | Admitting: Neurology

## 2017-01-26 ENCOUNTER — Encounter: Payer: Self-pay | Admitting: Neurology

## 2017-01-26 VITALS — BP 158/88 | HR 118 | Resp 18 | Ht 71.5 in | Wt 300.0 lb

## 2017-01-26 DIAGNOSIS — R0683 Snoring: Secondary | ICD-10-CM

## 2017-01-26 DIAGNOSIS — G4719 Other hypersomnia: Secondary | ICD-10-CM | POA: Diagnosis not present

## 2017-01-26 DIAGNOSIS — R351 Nocturia: Secondary | ICD-10-CM

## 2017-01-26 DIAGNOSIS — R0981 Nasal congestion: Secondary | ICD-10-CM

## 2017-01-26 DIAGNOSIS — R0681 Apnea, not elsewhere classified: Secondary | ICD-10-CM

## 2017-01-26 DIAGNOSIS — J301 Allergic rhinitis due to pollen: Secondary | ICD-10-CM

## 2017-01-26 NOTE — Progress Notes (Signed)
Subjective:    Patient ID: [name] [name] is a 36 y.o. male.  HPI     [name] Foley, MD, PhD [facility] [address] [address] [place], [place] [address]  Dear [name] [name],   I saw your patient, [name] [name], upon your kind request in my neurologic clinic today for initial consultation of his sleep disorder, in particular, concern for underlying obstructive sleep apnea. The patient is accompanied by his 3 kids today. Of note, he no showed for an appointment on 11/17/2016. As you know, [name] [name] is a 36 year old right-handed gentleman with an underlying medical history of shoulder pain, left inguinal hernia, paroxysmal SVT, status post ablation in April 2017, as well as morbid obesity, who reports snoring, daytime somnolence as well as witnessed apneic breathing pauses while asleep per wife's report. I reviewed your office note from 09/15/2016. He lives at home with his wife and 3 daughters, ages 69, 18 and 1. His 32-year-old daughter sleeps in their bedroom in her own bed. His Epworth sleepiness score is 15 out of 24 today, his fatigue score is 34 out of 63. He drinks alcohol infrequently and caffeine in the form of tea, 1-2 cups per day, typically no coffee or sodas on a daily basis. He quit smoking a year and a half ago or so. 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 He used to work second shift up until a couple of years ago. His bedtime is typically around 10 PM, wakeup time is 5 AM. He denies restless leg symptoms. He denies morning headaches but has nocturia most nights of the week. He suffers from allergic rhinitis, worse during the summer time. He has significant nasal congestion and mouth breathes particularly at night. He has woken himself up with a sense of gasping for air. His wife has sleep apnea and uses a CPAP machine.  His Past Medical History Is Significant For: Past Medical History:  Diagnosis Date  . Paroxysmal supraventricular  tachycardia (HCC) 01/01/2016   s/p ablation    His Past Surgical History Is Significant For: Past Surgical History:  Procedure Laterality Date  . SHOULDER SURGERY Right 04/15/2016   Dr. Madelon [name], torn labrum  . SUPRAVENTRICULAR TACHYCARDIA ABLATION      His Family History Is Significant For: Family History  Problem Relation Age of Onset  . Diabetes Mother   . Diabetes Father   . Diabetes Brother     His Social History Is Significant For: Social History   Social History  . Marital status: Married    Spouse name: [name] [name]  . Number of children: 3  . Years of education: 12+   Occupational History  . Paint Mixer     [name] [name]   Social History Main Topics  . Smoking status: Former Games developer  . Smokeless tobacco: Never Used  . Alcohol use 0.6 oz/week    1 Standard drinks or equivalent per week     Comment: rarely  . Drug use: No  . Sexual activity: Yes    Partners: Female   Other Topics Concern  . None   Social History Narrative   Lives with his wife and their 3 daughters.   Denies caffeine use     His Allergies Are:  No Known Allergies:   His Current Medications Are:  Outpatient Encounter Prescriptions as of 01/26/2017  Medication Sig  . meloxicam (MOBIC) 15 MG tablet Take 1 tablet (15 mg total) by mouth daily.  . metoprolol tartrate (LOPRESSOR) 25  MG tablet Take 1 tablet (25 mg total) by mouth 2 (two) times daily.   No facility-administered encounter medications on file as of 01/26/2017.   :  Review of Systems:  Out of a complete 14 point review of systems, all are reviewed and negative with the exception of these symptoms as listed below:  Review of Systems  Neurological:       Patient has trouble staying asleep, snores, witnessed apnea, wakes up feeling tired, daytime fatigues, rarely takes naps  Epworth Sleepiness Scale 0= would never doze 1= slight chance of dozing 2= moderate chance of dozing 3= high chance of dozing  Sitting and  reading:3 Watching TV:2 Sitting inactive in a public place (ex. Theater or meeting):3 As a passenger in a car for an hour without a break:1 Lying down to rest in the afternoon:3 Sitting and talking to someone:1 Sitting quietly after lunch (no alcohol):2 In a car, while stopped in traffic:0 Total:15   Objective:  Neurologic Exam  Physical Exam Physical Examination:   Vitals:   01/26/17 1548  BP: (!) 158/88  Pulse: (!) 118  Resp: 18    General Examination: The patient is a very pleasant 36 y.o. male in no acute distress. He appears well-developed and well-nourished and well groomed.   HEENT: Normocephalic, atraumatic, pupils are equal, round and reactive to light and accommodation. Extraocular tracking is good without limitation to gaze excursion or nystagmus noted. Normal smooth pursuit is noted. Hearing is grossly intact. Face is symmetric with normal facial animation and normal facial sensation. Speech is clear with no dysarthria noted. There is no hypophonia. There is no lip, neck/head, jaw or voice tremor. Neck is supple with full range of passive and active motion. Oropharynx exam reveals: mild mouth dryness, adequate dental hygiene and marked airway crowding, due to 3+ tonsils, R more prominent than L. Mallampati is class II. Tongue protrudes centrally and palate elevates symmetrically. Neck size is 19 inches. He has an absent overbite. Nasal inspection reveals significant nasal mucosal bogginess, some redness and no septal deviation.   Chest: Clear to auscultation without wheezing, rhonchi or crackles noted.  Heart: S1+S2+0, regular and normal without murmurs, rubs or gallops noted.   Abdomen: Soft, non-tender and non-distended with normal bowel sounds appreciated on auscultation.  Extremities: There is no pitting edema in the distal lower extremities bilaterally. Pedal pulses are intact.  Skin: Warm and dry without trophic changes noted.  Musculoskeletal: exam reveals no  obvious joint deformities, tenderness or joint swelling or erythema.   Neurologically:  Mental status: The patient is awake, alert and oriented in all 4 spheres. His immediate and remote memory, attention, language skills and fund of knowledge are appropriate. There is no evidence of aphasia, agnosia, apraxia or anomia. Speech is clear with normal prosody and enunciation. Thought process is linear. Mood is normal and affect is normal.  Cranial nerves II - XII are as described above under HEENT exam. In addition: shoulder shrug is normal with equal shoulder height noted. Motor exam: Normal bulk, strength and tone is noted. There is no drift, tremor or rebound. Romberg is negative. Reflexes are 2+ throughout. Fine motor skills and coordination: intact with normal finger taps, normal hand movements, normal rapid alternating patting, normal foot taps and normal foot agility.  Cerebellar testing: No dysmetria or intention tremor on finger to nose testing. Heel to shin is unremarkable bilaterally. There is no truncal or gait ataxia.  Sensory exam: intact to light touch, vibration, temperature sense in the  upper and lower extremities.  Gait, station and balance: He stands easily. No veering to one side is noted. No leaning to one side is noted. Posture is age-appropriate and stance is narrow based. Gait shows normal stride length and normal pace. No problems turning are noted. Tandem walk is unremarkable.   Assessment and Plan:  In summary, [name] [name] is a very pleasant 36 y.o.-year old male with an underlying medical history of shoulder pain, left inguinal hernia, paroxysmal SVT, status post ablation in April 2017, as well as morbid obesity, whose history and physical exam are in keeping with obstructive sleep apnea (OSA). I had a long chat with the patient about my findings and the diagnosis of OSA, its prognosis and treatment options. We talked about medical treatments, surgical interventions and  non-pharmacological approaches. I explained in particular the risks and ramifications of untreated moderate to severe OSA, especially with respect to developing cardiovascular disease down the Road, including congestive heart failure, difficult to treat hypertension, cardiac arrhythmias, or stroke. Even type 2 diabetes has, in part, been linked to untreated OSA. Symptoms of untreated OSA include daytime sleepiness, memory problems, mood irritability and mood disorder such as depression and anxiety, lack of energy, as well as recurrent headaches, especially morning headaches. We talked about trying to maintain a healthy lifestyle in general, as well as the importance of weight control. I encouraged the patient to eat healthy, exercise daily and keep well hydrated, to keep a scheduled bedtime and wake time routine, to not skip any meals and eat healthy snacks in between meals. I advised the patient not to drive when feeling sleepy. I recommended the following at this time: sleep study with potential positive airway pressure titration. (We will score hypopneas at 4%).   I explained the sleep test procedure to the patient and also outlined possible surgical and non-surgical treatment options of OSA, including the use of a custom-made dental device (which would require a referral to a specialist dentist or oral surgeon), upper airway surgical options, such as pillar implants, radiofrequency surgery, tongue base surgery, and UPPP (which would involve a referral to an ENT surgeon). Rarely, jaw surgery such as mandibular advancement may be considered.  I also explained the CPAP treatment option to the patient, who indicated that he would be willing to try CPAP if the need arises. I explained the importance of being compliant with PAP treatment, not only for insurance purposes but primarily to improve His symptoms, and for the patient's longterm health benefit, including to reduce His cardiovascular risks. I answered  all his questions today and the patient was in agreement. I would like to see him back after the sleep study is completed and encouraged him to call with any interim questions, concerns, problems or updates.   Thank you very much for allowing me to participate in the care of this nice patient. If I can be of any further assistance to you please do not hesitate to call me at [phone].  Sincerely,   [name] Foley, MD, PhD

## 2017-01-26 NOTE — Patient Instructions (Signed)

## 2017-12-09 ENCOUNTER — Encounter: Payer: Self-pay | Admitting: Physician Assistant

## 2018-01-17 IMAGING — DX DG SHOULDER 2+V*R*
3 series · 3 of 3 positions shown · non-contrast
Comparison: None.

CLINICAL DATA: Shoulder pain for 2 weeks, exacerbated by movement.

EXAM:
RIGHT SHOULDER - 2+ VIEW

[shoulder ap]
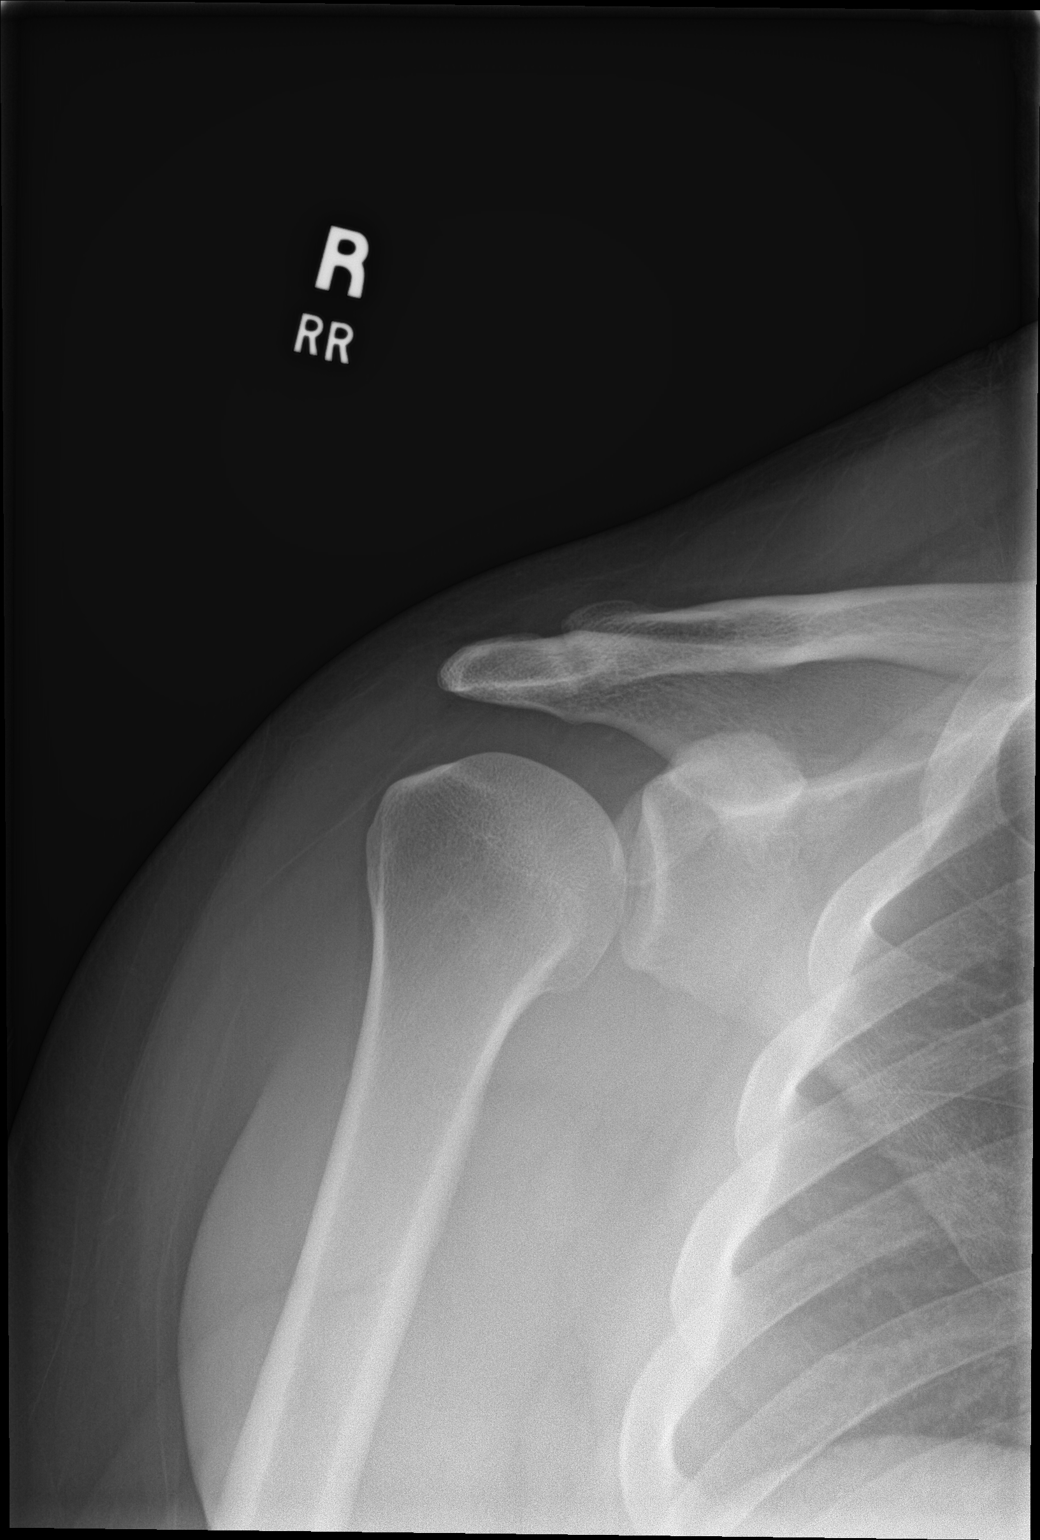

[shoulder y-view]
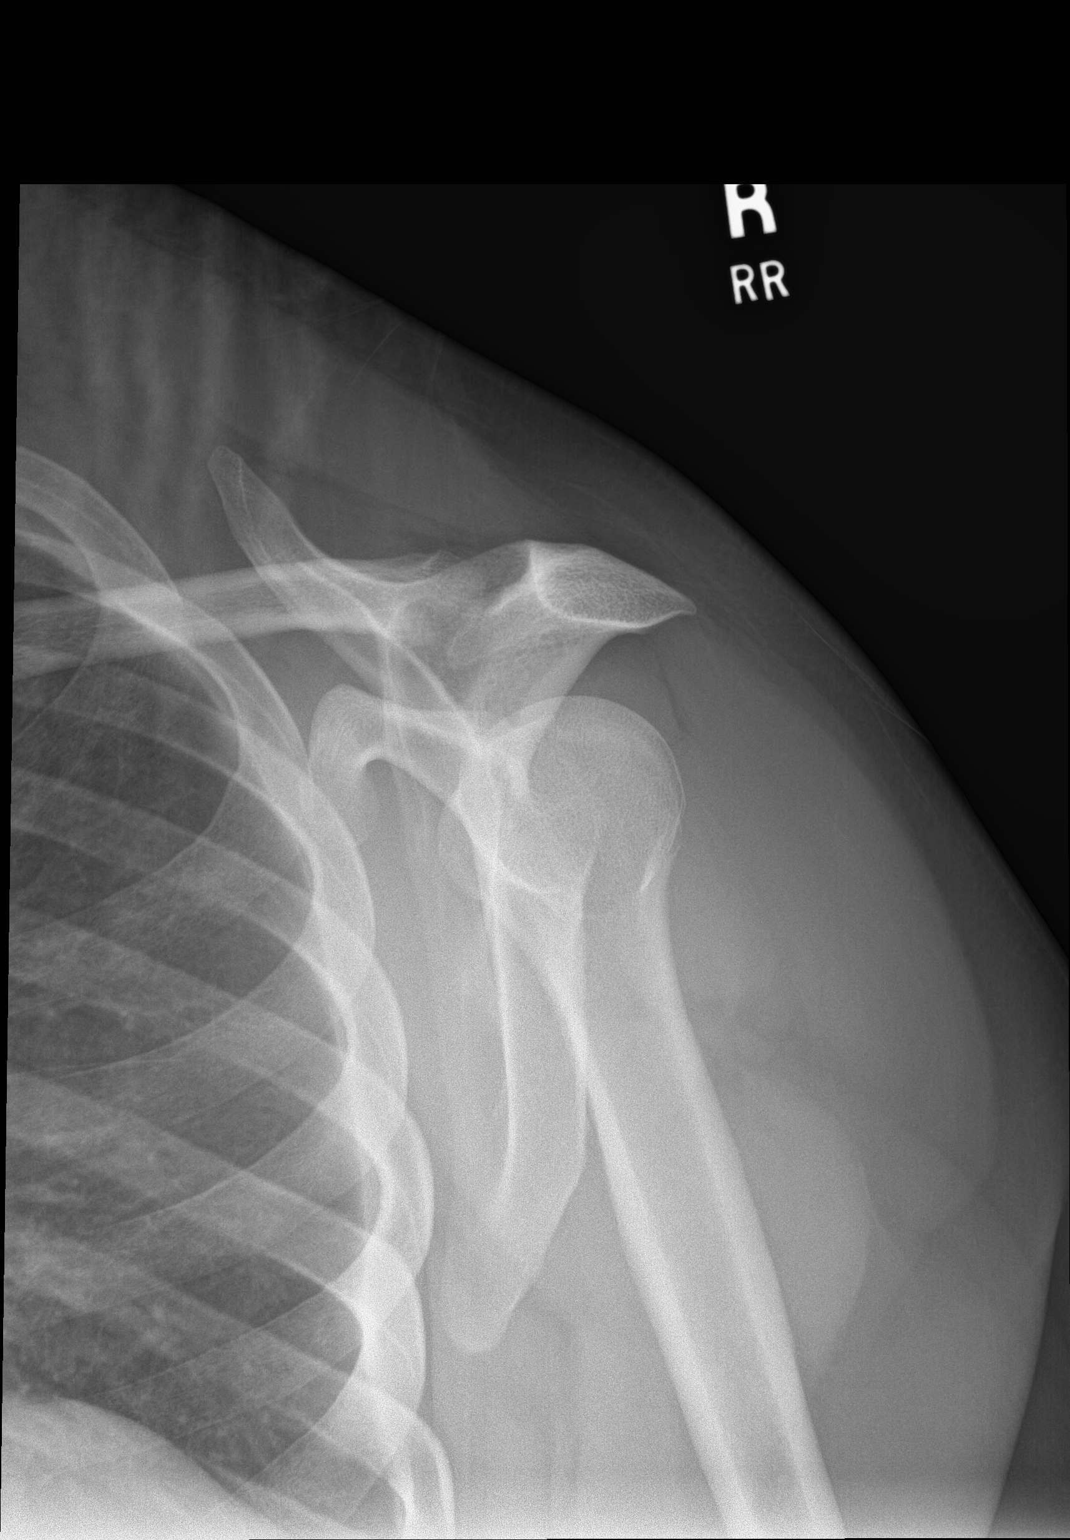

[shoulder axial]
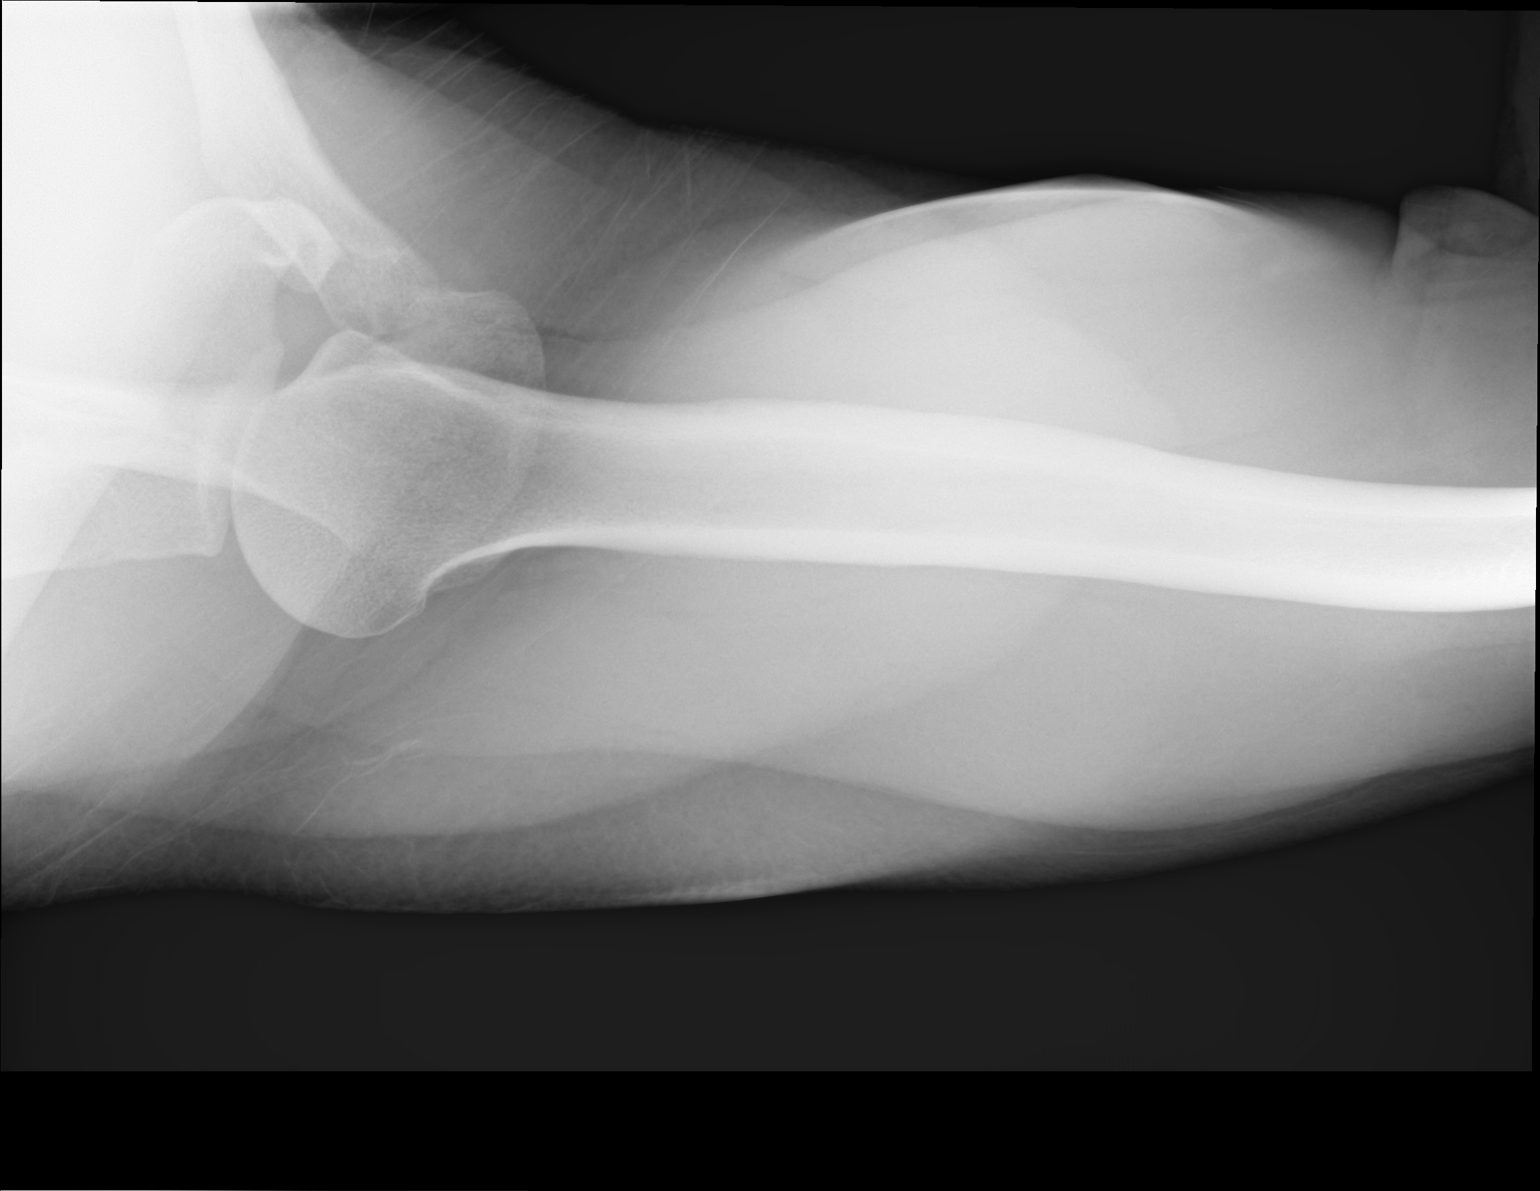

[3 of 3 positions shown; findings below may reference images not displayed]

FINDINGS: There is no evidence of fracture or dislocation. There is no
evidence of arthropathy or other focal bone abnormality. Soft
tissues are unremarkable.
IMPRESSION: Negative.

## 2018-02-10 IMAGING — MR MR SHOULDER*R* W/O CM
4 of 5 series · 14 of 40 positions shown · non-contrast
Comparison: Radiograph 03/12/2016

CLINICAL DATA: Right shoulder pain for 3 weeks.  No known injury.

EXAM:
MRI OF THE RIGHT SHOULDER WITHOUT CONTRAST
TECHNIQUE: Multiplanar, multisequence MR imaging of the shoulder was performed.
No intravenous contrast was administered.

[Series 9: T2 fat-sat · axial · right · 3.0mm · 0.44mm/px · z∈[-20,+45]mm · 3 of 25 slices shown (1 of 3)]
[im 3/25]
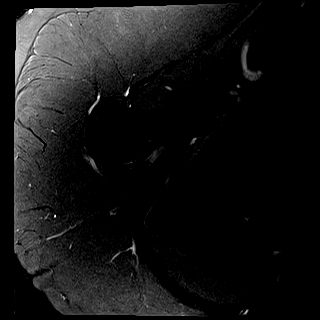
[im 14/25]
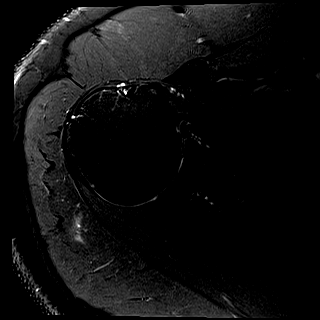
[im 22/25]
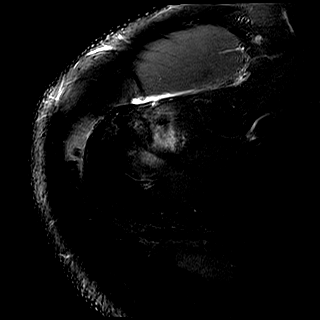

[Series 10: T2 fat-sat · sagittal · right · 3.0mm · 0.44mm/px · 3 of 24 slices shown (2 of 3)]
[im 4/24]
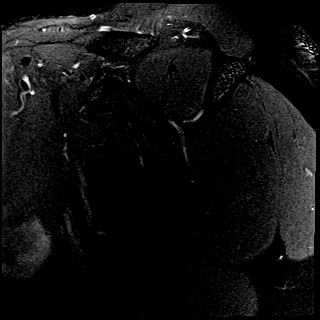
[im 14/24]
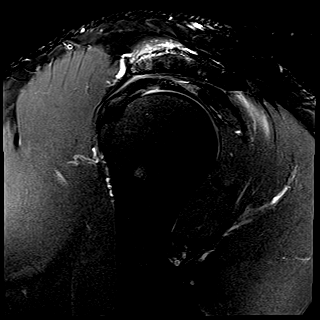
[im 20/24]
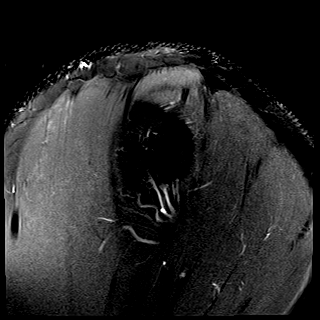

[Series 12: PD · coronal · right · 3.0mm · 0.18mm/px · 5 of 21 slices shown]
[im 1/21]
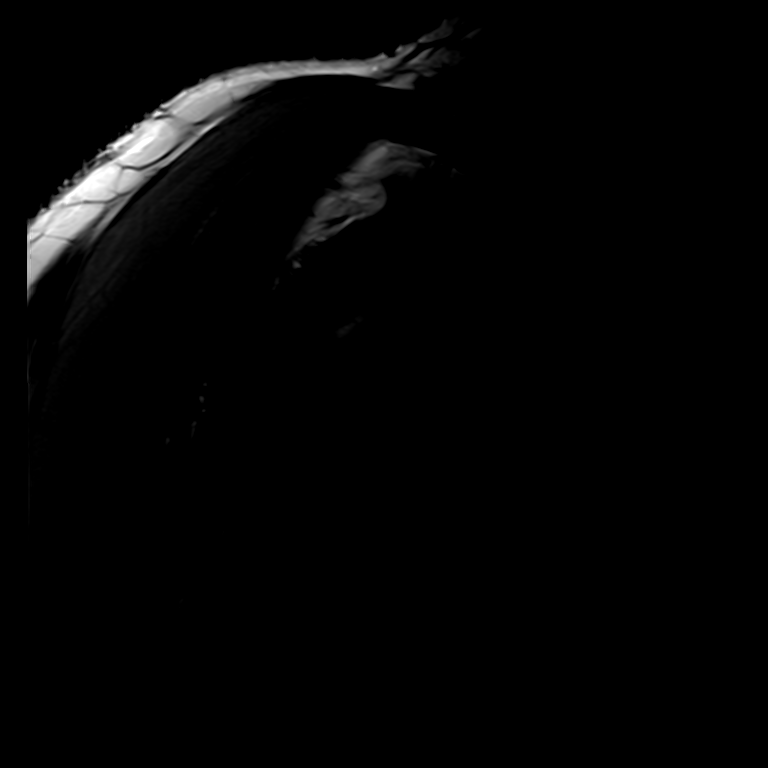
[im 4/21]
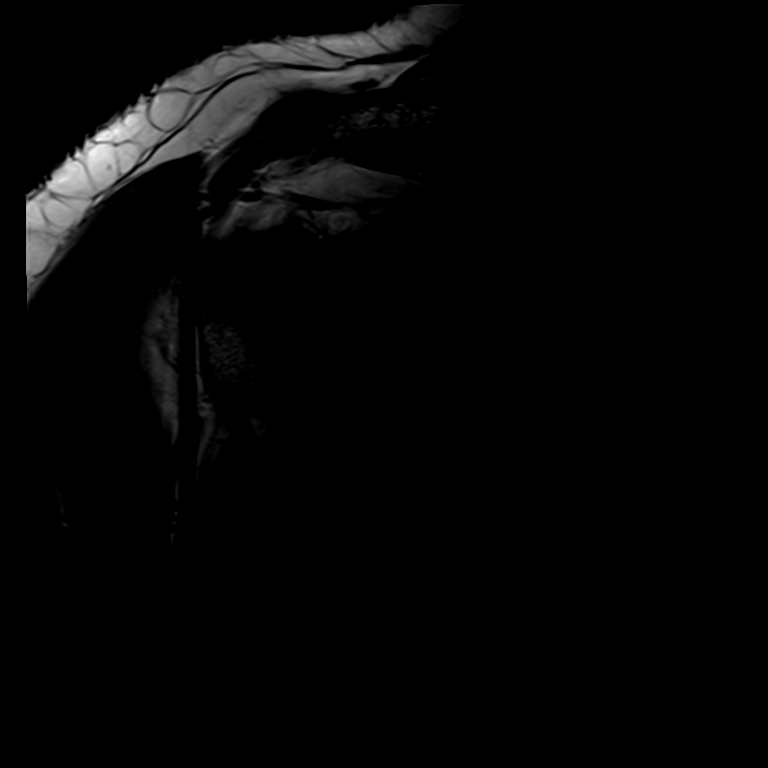
[im 7/21]
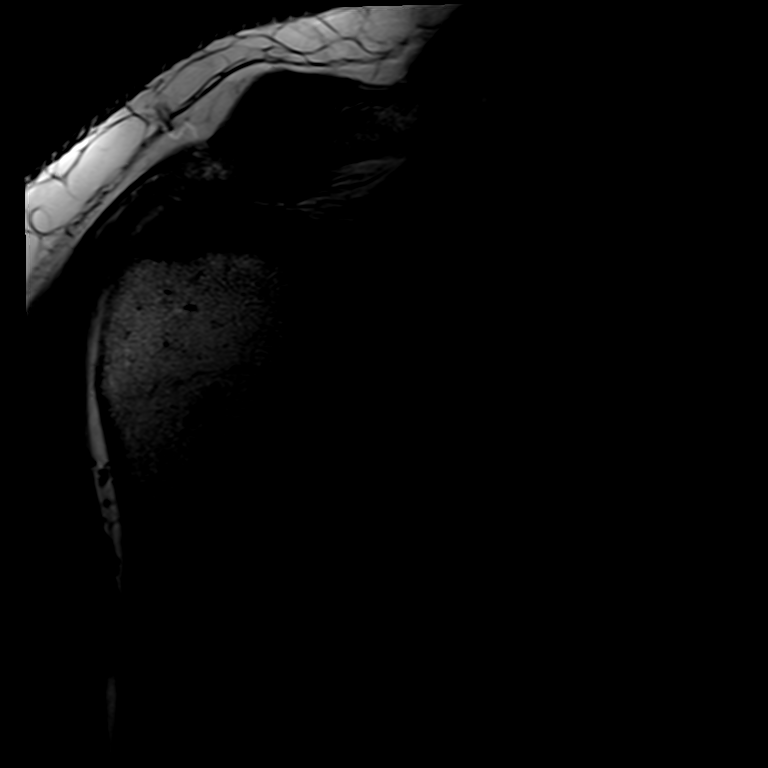
[im 11/21]
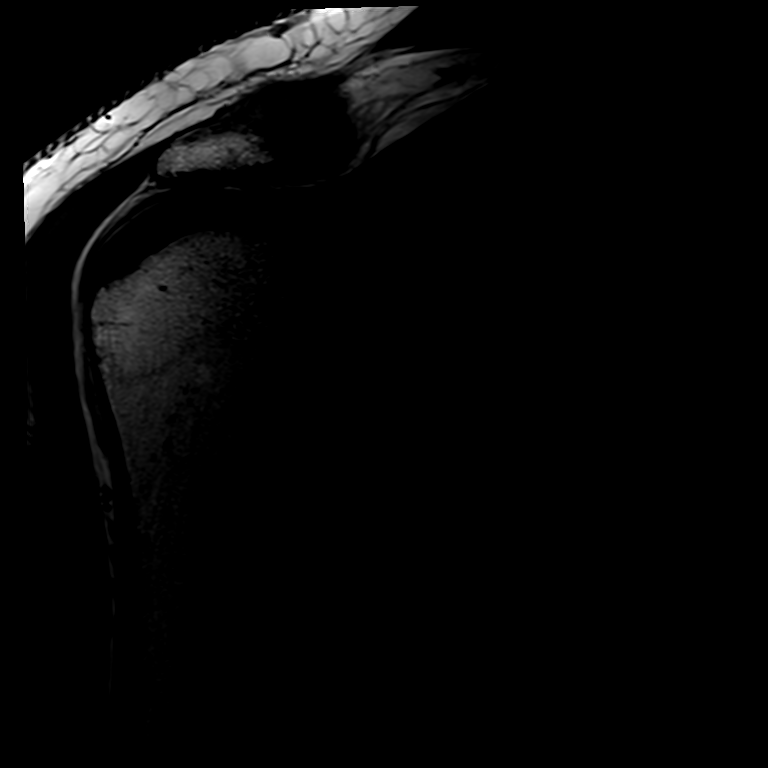
[im 17/21]
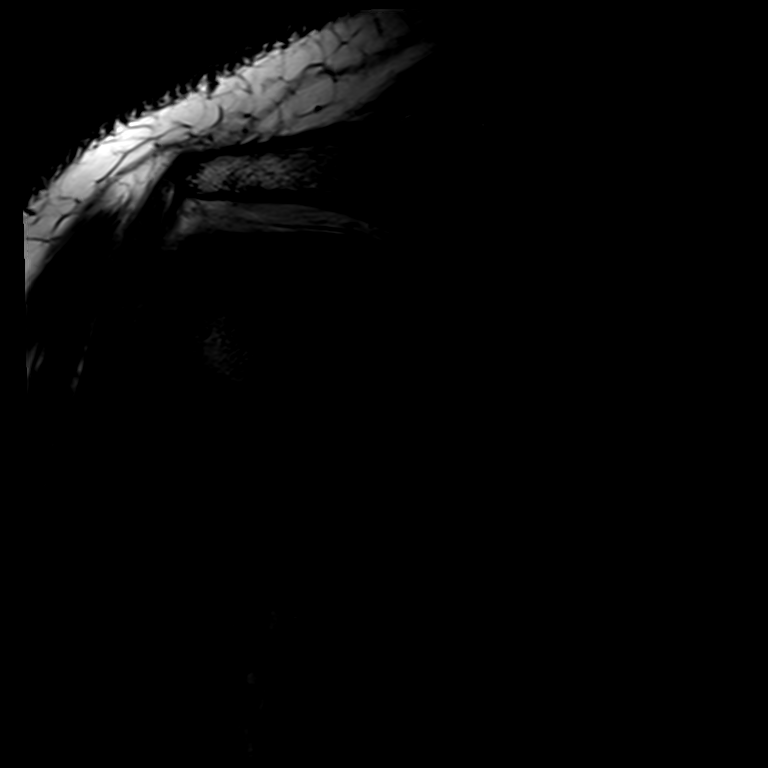

[Series 13: T2 fat-sat · coronal · right · 3.0mm · 0.22mm/px · 3 of 21 slices shown (3 of 3)]
[im 4/21]
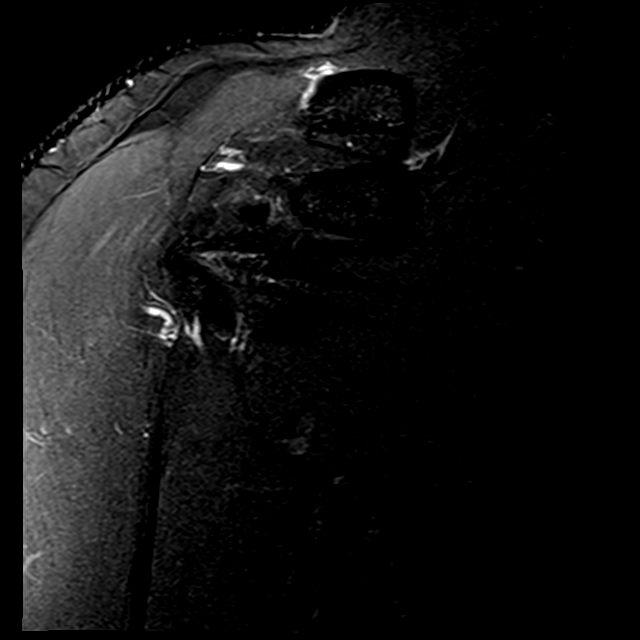
[im 11/21]
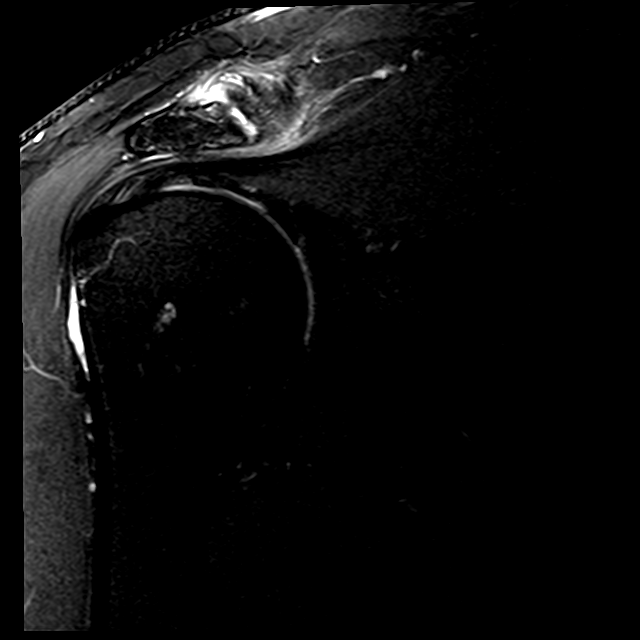
[im 17/21]
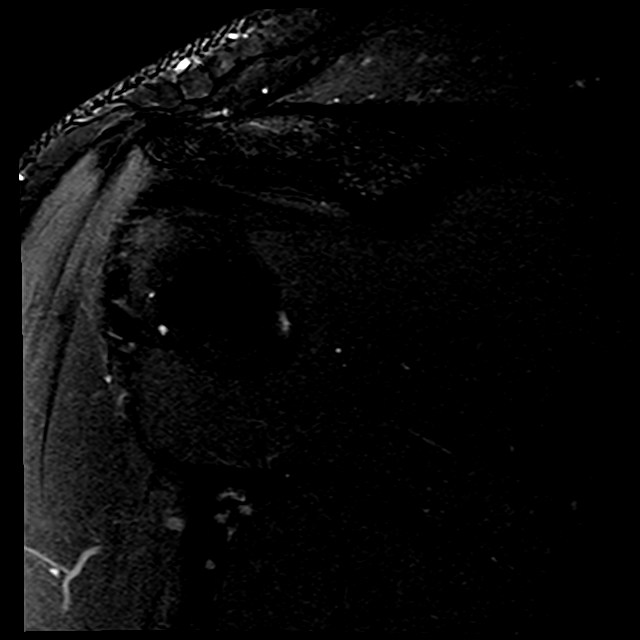

[14 of 40 positions shown; findings below may reference images not displayed]

FINDINGS: Rotator cuff: Mild to moderate rotator cuff tendinopathy/ tendinosis
with interstitial tear is and bursal surface fraying. No partial or
full-thickness tear.

Muscles:  Normal

Biceps long head:  Intact

Acromioclavicular Joint: Significant AC joint inflammation/edema. I
do not see any significant degenerative changes but there is marked
edema in the distal clavicle and a few small subchondral cysts. This
could be a stress reaction or possible acro-osteolysis. Recommend
correlation with AC joint pain. No AC joint separation. The acromion
is type 2 in shape no lateral downsloping or significant
undersurface spurring.

Glenohumeral Joint: No degenerative changes or joint effusion. No
synovitis.

Labrum: The anterior labrum demonstrates degenerative fraying
changes. Could not exclude a tear. Minimal free edge fraying of the
posterior labrum. The superior labrum is intact.

Bones: No acute bony findings other than the edema in the distal
clavicle.

Other: Mild subacromial/subdeltoid bursitis.
IMPRESSION: 1. Marked edema in and around the AC suggesting an ongoing stress
related process possible AC joint sprain or acro-osteolysis.
2. Mild to moderate rotator cuff tendinopathy/tendinosis with small
interstitial tears. No full-thickness tear.
3. Possible anterior labral tear. Recommend correlation with
clinical findings. MR arthrography may be helpful for further
evaluation if clinically necessary.

## 2019-05-02 DIAGNOSIS — R739 Hyperglycemia, unspecified: Secondary | ICD-10-CM | POA: Insufficient documentation

## 2019-05-16 ENCOUNTER — Other Ambulatory Visit: Payer: Self-pay

## 2019-05-16 ENCOUNTER — Ambulatory Visit: Payer: BC Managed Care – PPO | Admitting: Neurology

## 2019-05-16 ENCOUNTER — Encounter: Payer: Self-pay | Admitting: Neurology

## 2019-05-16 VITALS — BP 139/86 | HR 78 | Temp 98.0°F | Ht 73.0 in | Wt 318.3 lb

## 2019-05-16 DIAGNOSIS — R0683 Snoring: Secondary | ICD-10-CM

## 2019-05-16 DIAGNOSIS — R51 Headache: Secondary | ICD-10-CM | POA: Diagnosis not present

## 2019-05-16 DIAGNOSIS — R0681 Apnea, not elsewhere classified: Secondary | ICD-10-CM | POA: Diagnosis not present

## 2019-05-16 DIAGNOSIS — R519 Headache, unspecified: Secondary | ICD-10-CM

## 2019-05-16 DIAGNOSIS — J351 Hypertrophy of tonsils: Secondary | ICD-10-CM

## 2019-05-16 DIAGNOSIS — R635 Abnormal weight gain: Secondary | ICD-10-CM

## 2019-05-16 DIAGNOSIS — R351 Nocturia: Secondary | ICD-10-CM

## 2019-05-16 DIAGNOSIS — Z6841 Body Mass Index (BMI) 40.0 and over, adult: Secondary | ICD-10-CM

## 2019-05-16 DIAGNOSIS — G4719 Other hypersomnia: Secondary | ICD-10-CM

## 2019-05-16 NOTE — Progress Notes (Signed)
Subjective:    Patient ID: [name] [name] is a 38 y.o. male.  HPI     [name], MD, PhD [facility] [address] [place], [place] [address]  Dear [name] [name],    I saw your patient, [name] [name], upon your kind request in my sleep clinic today for reevaluation for possible underlying obstructive sleep apnea.  The patient is unaccompanied today.  I first met him on 01/26/2017 for sleep evaluation but sleep study testing did not come to fruition at the time, due to his moving to [place].  They have moved back from [place] in May 2020.  As you know, [name] [name] is a 38 year old right-handed gentleman with an underlying medical history of paroxysmal SVT with status post ablation, and morbid obesity with a BMI of over 40, who reports snoring, witnessed apneas, excessive daytime somnolence, some morning headaches and nocturia about once per average night.  He has had some weight gain since we first met in the realm of 18 pounds.  His bedtime is between 1030 and 11, rise time around 7.  He works as a Barrister's clerk, he lives with his wife who has sleep apnea and uses a CPAP machine.  They have 3 children, ages 67, 40 and 4.  He has occasionally woken up with a sense of gasping or a sense of pausing in his breathing but goes right back to sleep typically.  He has no obvious family history of sleep apnea.  They have no four-legged pets, to [place] fish.  He does have nasal congestion and some drainage and allergy symptoms, takes over-the-counter Zyrtec as needed.  He drinks no day-to-day caffeine, tries to hydrate well with water, quit smoking about a year ago and drinks alcohol very rarely.  Previously:  01/26/2017: [name] [name] is a 38 year old right-handed gentleman with an underlying medical history of shoulder pain, left inguinal hernia, paroxysmal SVT, status post ablation in April 2017, as well as morbid obesity, who reports snoring, daytime somnolence as well as  witnessed apneic breathing pauses while asleep per wife's report. I reviewed your office note from 09/15/2016. He lives at home with his wife and 3 daughters, ages 69, 61 and 64. His 24-year-old daughter sleeps in their bedroom in her own bed. His Epworth sleepiness score is 15 out of 24 today, his fatigue score is 34 out of 63. He drinks alcohol infrequently and caffeine in the form of tea, 1-2 cups per day, typically no coffee or sodas on a daily basis. He quit smoking a year and a half ago or so. 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 He used to work second shift up until a couple of years ago. His bedtime is typically around 10 PM, wakeup time is 5 AM. He denies restless leg symptoms. He denies morning headaches but has nocturia most nights of the week. He suffers from allergic rhinitis, worse during the summer time. He has significant nasal congestion and mouth breathes particularly at night. He has woken himself up with a sense of gasping for air. His wife has sleep apnea and uses a CPAP machine.  His Past Medical History Is Significant For: Past Medical History:  Diagnosis Date  . Paroxysmal supraventricular tachycardia ([place]) 01/01/2016   s/p ablation    His Past Surgical History Is Significant For: Past Surgical History:  Procedure Laterality Date  . SHOULDER SURGERY Right 04/15/2016   Dr. French Ana, torn labrum  . SUPRAVENTRICULAR TACHYCARDIA ABLATION  His Family History Is Significant For: Family History  Problem Relation Age of Onset  . Diabetes Mother   . Diabetes Father   . Diabetes Brother     His Social History Is Significant For: Social History   Socioeconomic History  . Marital status: Married    Spouse name: [name] [name]  . Number of children: 3  . Years of education: 12+  . Highest education level: Not on file  Occupational History  . Occupation: Energy manager    Comment: [employer]  . Financial resource strain: Not on file  .  Food insecurity    Worry: Not on file    Inability: Not on file  . Transportation needs    Medical: Not on file    Non-medical: Not on file  Tobacco Use  . Smoking status: Former Smoker    Quit date: 2019    Years since quitting: 1.6  . Smokeless tobacco: Never Used  Substance and Sexual Activity  . Alcohol use: Yes    Alcohol/week: 1.0 standard drinks    Types: 1 Standard drinks or equivalent per week    Comment: rarely  . Drug use: No  . Sexual activity: Yes    Partners: Female  Lifestyle  . Physical activity    Days per week: Not on file    Minutes per session: Not on file  . Stress: Not on file  Relationships  . Social Herbalist on phone: Not on file    Gets together: Not on file    Attends religious service: Not on file    Active member of club or organization: Not on file    Attends meetings of clubs or organizations: Not on file    Relationship status: Not on file  Other Topics Concern  . Not on file  Social History Narrative   Lives with his wife and their 3 daughters.   Denies caffeine use     His Allergies Are:  No Known Allergies:   His Current Medications Are:  Outpatient Encounter Medications as of 05/16/2019  Medication Sig  . [DISCONTINUED] meloxicam (MOBIC) 15 MG tablet Take 1 tablet (15 mg total) by mouth daily.  . [DISCONTINUED] metoprolol tartrate (LOPRESSOR) 25 MG tablet Take 1 tablet (25 mg total) by mouth 2 (two) times daily.   No facility-administered encounter medications on file as of 05/16/2019.   :  Review of Systems:  Out of a complete 14 point review of systems, all are reviewed and negative with the exception of these symptoms as listed below: Review of Systems  Neurological:       Here for sleep consult. Last visit with our office was in 2018 sleep study was not completed due to pt moving out of town. Pt reports snoring is present and feels like his tongue slides to the back of his throat when he is lying on his back. Also  reports waking up from sleep gasping for breath.   Epworth Sleepiness Scale 0= would never doze 1= slight chance of dozing 2= moderate chance of dozing 3= high chance of dozing  Sitting and reading:3 Watching TV:1 Sitting inactive in a public place (ex. Theater or meeting):1 As a passenger in a car for an hour without a break:1 Lying down to rest in the afternoon:2 Sitting and talking to someone:1 Sitting quietly after lunch (no alcohol):1 In a car, while stopped in traffic:0 Total:10     Objective:  Neurological Exam  Physical  Exam Physical Examination:   Vitals:   05/16/19 1036  BP: 139/86  Pulse: 78  Temp: 98 F (36.7 C)   General Examination: The patient is a very pleasant 38 y.o. male in no acute distress. He appears well-developed and well-nourished and well groomed.   HEENT: Normocephalic, atraumatic, pupils are equal, round and reactive to light and accommodation. Extraocular tracking is good without limitation to gaze excursion or nystagmus noted. Normal smooth pursuit is noted. Hearing is grossly intact. Face is symmetric with normal facial animation and normal facial sensation. Speech is clear with no dysarthria noted. There is no hypophonia. There is no lip, neck/head, jaw or voice tremor. Neck is supple with full range of passive and active motion. Oropharynx exam reveals: mild mouth dryness, adequate dental hygiene and marked airway crowding, due to 3+ tonsils, and prominent uvula, Mallampati is class II. Tongue protrudes centrally and palate elevates symmetrically. Neck size is 18.5 inches. He has a near-absent overbite. Nasal inspection reveals significant nasal mucosal bogginess, some redness, but no septal deviation.   Chest: Clear to auscultation without wheezing, rhonchi or crackles noted.  Heart: S1+S2+0, regular and normal without murmurs, rubs or gallops noted.   Abdomen: Soft, non-tender and non-distended with normal bowel sounds appreciated on  auscultation.  Extremities: There is no pitting edema in the distal lower extremities bilaterally.   Skin: Warm and dry without trophic changes noted.  Musculoskeletal: exam reveals no obvious joint deformities, tenderness or joint swelling or erythema.   Neurologically:  Mental status: The patient is awake, alert and oriented in all 4 spheres. His immediate and remote memory, attention, language skills and fund of knowledge are appropriate. There is no evidence of aphasia, agnosia, apraxia or anomia. Speech is clear with normal prosody and enunciation. Thought process is linear. Mood is normal and affect is normal.  Cranial nerves II - XII are as described above under HEENT exam.. Motor exam: Normal bulk, strength and tone is noted. There is no drift, tremor or rebound. Romberg is negative. Reflexes are 1-2+ throughout. Fine motor skills and coordination: grossly intact.  Cerebellar testing: No dysmetria or intention tremor. There is no truncal or gait ataxia.  Sensory exam: intact to light touch.  Gait, station and balance: He stands easily. No veering to one side is noted. No leaning to one side is noted. Posture is age-appropriate and stance is narrow based. Gait shows normal stride length and normal pace. No problems turning are noted. Tandem walk is unremarkable.   Assessment and Plan:  In summary, Derin Saxton is a very pleasant 38 year old male with an underlying medical history of paroxysmal SVT, status post ablation in April 2017, as well as morbid obesity, whose history and physical exam are Concerning for underlying obstructive sleep apnea. We talked about OSA, its prognosis and treatment options and the need for treating OSA to prevent long-term cardiovascular disease, particularly when the degree of sleep apnea is in the moderate to severe range.  We talked about treatment options including CPAP, surgical options and dental options as well as the implantable device, inspire.   I recommended the following at this time: sleep study.  I explained the sleep test procedure to the patient. He indicated that he would be willing to try CPAP if the need arises.  I answered all his questions today and the patient was in agreement. I would plan to see him back after the sleep study is completed and encouraged him to call with any interim questions, concerns,  problems or updates.   Thank you very much for allowing me to participate in the care of this nice patient. If I can be of any further assistance to you please do not hesitate to call me at [phone].  Sincerely,   [name], MD, PhD

## 2019-05-16 NOTE — Patient Instructions (Addendum)
It was nice to see you again today!  Here is what we discussed today and what we came up with as our plan for you:    Based on your symptoms and your exam I believe you are at risk for obstructive sleep apnea (aka OSA), and I think we should proceed with a sleep study to determine whether you do or do not have OSA and how severe it is. Even, if you have mild OSA, I may want you to consider treatment with CPAP, as treatment of even borderline or mild sleep apnea can result and improvement of symptoms such as sleep disruption, daytime sleepiness, nighttime bathroom breaks, restless leg symptoms, improvement of headache syndromes, even improved mood disorder.   Please remember, the long-term risks and ramifications of untreated moderate to severe obstructive sleep apnea are: increased Cardiovascular disease, including congestive heart failure, stroke, difficult to control hypertension, treatment resistant obesity, arrhythmias, especially irregular heartbeat commonly known as A. Fib. (atrial fibrillation); even type 2 diabetes has been linked to untreated OSA.   Sleep apnea can cause disruption of sleep and sleep deprivation in most cases, which, in turn, can cause recurrent headaches, problems with memory, mood, concentration, focus, and vigilance. Most people with untreated sleep apnea report excessive daytime sleepiness, which can affect their ability to drive. Please do not drive if you feel sleepy. Patients with sleep apnea developed difficulty initiating and maintaining sleep (aka insomnia).   Having sleep apnea may increase your risk for other sleep disorders, including involuntary behaviors sleep such as sleep terrors, sleep talking, sleepwalking.    Having sleep apnea can also increase your risk for restless leg syndrome and leg movements at night.   Please note that untreated obstructive sleep apnea may carry additional perioperative morbidity. Patients with significant obstructive sleep apnea  (typically, in the moderate to severe degree) should receive, if possible, perioperative PAP (positive airway pressure) therapy and the surgeons and particularly the anesthesiologists should be informed of the diagnosis and the severity of the sleep disordered breathing.   I will likely see you back after your sleep study to go over the test results and where to go from there. We will call you after your sleep study to advise about the results (most likely, you will hear from [place], my nurse) and to set up an appointment at the time, as necessary.    Our sleep lab administrative assistant will call you to schedule your sleep study and give you further instructions, regarding the check in process for the sleep study, arrival time, what to bring, when you can expect to leave after the study, etc., and to answer any other logistical questions you may have. If you don't hear back from her by about 2 weeks from now, please feel free to call her direct line at [phone] or you can call our general clinic number, or email [place] through My Chart.

## 2019-05-26 ENCOUNTER — Ambulatory Visit (INDEPENDENT_AMBULATORY_CARE_PROVIDER_SITE_OTHER): Payer: BC Managed Care – PPO | Admitting: Neurology

## 2019-05-26 ENCOUNTER — Other Ambulatory Visit: Payer: Self-pay

## 2019-05-26 DIAGNOSIS — R0681 Apnea, not elsewhere classified: Secondary | ICD-10-CM

## 2019-05-26 DIAGNOSIS — R351 Nocturia: Secondary | ICD-10-CM

## 2019-05-26 DIAGNOSIS — G4720 Circadian rhythm sleep disorder, unspecified type: Secondary | ICD-10-CM

## 2019-05-26 DIAGNOSIS — G4719 Other hypersomnia: Secondary | ICD-10-CM

## 2019-05-26 DIAGNOSIS — R0683 Snoring: Secondary | ICD-10-CM

## 2019-05-26 DIAGNOSIS — G4733 Obstructive sleep apnea (adult) (pediatric): Secondary | ICD-10-CM

## 2019-05-26 DIAGNOSIS — R635 Abnormal weight gain: Secondary | ICD-10-CM

## 2019-05-26 DIAGNOSIS — J351 Hypertrophy of tonsils: Secondary | ICD-10-CM

## 2019-05-26 DIAGNOSIS — R519 Headache, unspecified: Secondary | ICD-10-CM

## 2019-05-30 ENCOUNTER — Telehealth: Payer: Self-pay

## 2019-05-30 NOTE — Addendum Note (Signed)
Addended by: [name] on: 05/30/2019 08:50 AM   Modules accepted: Orders

## 2019-05-30 NOTE — Telephone Encounter (Signed)
-----   Message from [name], MD sent at 05/30/2019  8:50 AM EDT ----- Patient referred by Harrison Mons, PA, seen by me on k9/8/20, diagnostic PSG on 05/26/19.   Please call and notify the patient that the recent sleep study showed moderate to severe (in supine sleep) obstructive sleep apnea. I recommend treatment for this in the form of CPAP. This will require a repeat sleep study for proper titration and mask fitting and correct monitoring of the oxygen saturations. Please explain to patient. I have placed an order in the chart. Thanks.  [name], MD, PhD [facility])

## 2019-05-30 NOTE — Procedures (Signed)
PATIENT'S NAME:  [name] [name], [name] [name] DOB:      1981-01-24      MR#:    [id number]     DATE OF RECORDING: 05/26/2019 REFERRING M.D.:  Porfirio Oar, PA Study Performed:   Baseline Polysomnogram HISTORY: 38 year old man with a history of paroxysmal SVT with status post ablation, and morbid obesity with a BMI of over 40, who reports snoring, witnessed apneas, excessive daytime somnolence, some morning headaches and nocturia about once per average night. The patient endorsed the Epworth Sleepiness Scale at 15 points. The patient's weight 318 pounds with a height of 73 (inches), resulting in a BMI of 42.1 kg/m2. The patient's neck circumference measured 18.5 inches.  CURRENT MEDICATIONS: No current medications on file   PROCEDURE:  This is a multichannel digital polysomnogram utilizing the Somnostar 11.2 system.  Electrodes and sensors were applied and monitored per AASM Specifications.   EEG, EOG, Chin and Limb EMG, were sampled at 200 Hz.  ECG, Snore and Nasal Pressure, Thermal Airflow, Respiratory Effort, CPAP Flow and Pressure, Oximetry was sampled at 50 Hz. Digital video and audio were recorded.      BASELINE STUDY  Lights Out was at 20:28 and Lights On at 04:42.  Total recording time (TRT) was 494 minutes, with a total sleep time (TST) of 418.5 minutes.   The patient's sleep latency was 58.5 minutes, which is delayeed. REM latency was 164 minutes, which is delayed. The sleep efficiency was 84.7%.     SLEEP ARCHITECTURE: WASO (Wake after sleep onset) was 27 minutes with mild to moderate sleep fragmentation noted. There were 64 minutes in Stage N1, 263 minutes Stage N2, 13.5 minutes Stage N3 and 78 minutes in Stage REM.  The percentage of Stage N1 was 15.3%, which is increased, Stage N2 was 62.8%, which is increased, Stage N3 was 3.2% and Stage R (REM sleep) was 18.6%, which is near-normal.  RESPIRATORY ANALYSIS:  There were a total of 185 respiratory events:  19 obstructive apneas, 0 central apneas  and 0 mixed apneas with a total of 19 apneas and an apnea index (AI) of 2.7 /hour. There were 166 hypopneas with a hypopnea index of 23.8 /hour. The patient also had 0 respiratory event related arousals (RERAs).      The total APNEA/HYPOPNEA INDEX (AHI) was 26.5 /hour and the total RESPIRATORY DISTURBANCE INDEX was 0. 26.5 /hour.  30 events occurred in REM sleep and 280 events in NREM. The REM AHI was 23.1 /hour, versus a non-REM AHI of 27.3. The patient spent 66.5 minutes of total sleep time in the supine position and 352 minutes in non-supine. The supine AHI was 60.4 versus a non-supine AHI of 20.1.  OXYGEN SATURATION & C02:  The Wake baseline 02 saturation was 91%, with the lowest being 81%. Time spent below 89% saturation equaled 11 minutes.  PERIODIC LIMB MOVEMENTS: The patient had a total of 0 Periodic Limb Movements.  The Periodic Limb Movement (PLM) index was 0 and the PLM Arousal index was 0/hour. The arousals were noted as: 71 were spontaneous, 0 were associated with PLMs, 128 were associated with respiratory events.  Audio and video analysis did not show any abnormal or unusual movements, behaviors, phonations or vocalizations. The patient took 2 bathroom breaks. Moderate snoring was noted. The EKG was in keeping with normal sinus rhythm (NSR).  Post-study, the patient indicated that sleep was the same as usual.   IMPRESSION:  1. Obstructive Sleep Apnea (OSA) 2. Dysfunctions associated with sleep stages or arousal  from sleep  RECOMMENDATIONS:  1. This study demonstrates moderate to severe obstructive sleep apnea, with a total AHI of 26.5/hour, REM AHI of 23.1/hour, supine AHI of 60.4/hour and O2 nadir of 81%. Treatment with positive airway pressure in the form of CPAP is recommended. This will require a full night titration study to optimize therapy. Other treatment options may include - generally speaking - avoidance of supine sleep position along with weight loss, upper airway or jaw  surgery in selected patients or the use of an oral appliance in certain patients. ENT evaluation and/or consultation with a maxillofacial surgeon or dentist may be feasible in some instances.    2. Please note that untreated obstructive sleep apnea may carry additional perioperative morbidity. Patients with significant obstructive sleep apnea should receive perioperative PAP therapy and the surgeons and particularly the anesthesiologist should be informed of the diagnosis and the severity of the sleep disordered breathing. 3. This study shows sleep fragmentation and abnormal sleep stage percentages; these are nonspecific findings and per se do not signify an intrinsic sleep disorder or a cause for the patient's sleep-related symptoms. Causes include (but are not limited to) the first night effect of the sleep study, circadian rhythm disturbances, medication effect or an underlying mood disorder or medical problem.  4. The patient should be cautioned not to drive, work at heights, or operate dangerous or heavy equipment when tired or sleepy. Review and reiteration of good sleep hygiene measures should be pursued with any patient. 5. The patient will be seen in follow-up in the sleep clinic at [facility] for discussion of the test results, symptom and treatment compliance review, further management strategies, etc. The referring provider will be notified of the test results.  I certify that I have reviewed the entire raw data recording prior to the issuance of this report in accordance with the Standards of Accreditation of the American Academy of Sleep Medicine (AASM)   [name], MD, PhD Diplomat, American Board of Neurology and Sleep Medicine (Neurology and Sleep Medicine)  [] Juanell Fairly, PhD Diplomat, American Board of Psychiatry and Neurology  Diplomat, American Board of Sleep Medicine

## 2019-05-30 NOTE — Progress Notes (Signed)
Patient referred by Harrison Mons, PA, seen by me on k9/8/20, diagnostic PSG on 05/26/19.   Please call and notify the patient that the recent sleep study showed moderate to severe (in supine sleep) obstructive sleep apnea. I recommend treatment for this in the form of CPAP. This will require a repeat sleep study for proper titration and mask fitting and correct monitoring of the oxygen saturations. Please explain to patient. I have placed an order in the chart. Thanks.  [name], MD, PhD [facility])

## 2019-05-30 NOTE — Telephone Encounter (Signed)

## 2019-06-13 HISTORY — PX: INGUINAL HERNIA REPAIR: SUR1180

## 2019-07-18 ENCOUNTER — Other Ambulatory Visit (HOSPITAL_COMMUNITY): Payer: Self-pay

## 2019-07-18 ENCOUNTER — Other Ambulatory Visit (HOSPITAL_COMMUNITY)
Admission: RE | Admit: 2019-07-18 | Discharge: 2019-07-18 | Disposition: A | Discharge status: Home / Self Care | Payer: BC Managed Care – PPO | Source: Ambulatory Visit | Attending: Neurology | Admitting: Neurology

## 2019-07-18 DIAGNOSIS — Z01812 Encounter for preprocedural laboratory examination: Secondary | ICD-10-CM | POA: Insufficient documentation

## 2019-07-18 DIAGNOSIS — Z20828 Contact with and (suspected) exposure to other viral communicable diseases: Secondary | ICD-10-CM | POA: Diagnosis not present

## 2019-07-20 LAB — NOVEL CORONAVIRUS, NAA (HOSP ORDER, SEND-OUT TO REF LAB; TAT 18-24 HRS): SARS-CoV-2, NAA: NOT DETECTED

## 2019-07-21 ENCOUNTER — Ambulatory Visit (INDEPENDENT_AMBULATORY_CARE_PROVIDER_SITE_OTHER): Payer: BC Managed Care – PPO | Admitting: Neurology

## 2019-07-21 ENCOUNTER — Other Ambulatory Visit: Payer: Self-pay

## 2019-07-21 DIAGNOSIS — G4720 Circadian rhythm sleep disorder, unspecified type: Secondary | ICD-10-CM

## 2019-07-21 DIAGNOSIS — R519 Headache, unspecified: Secondary | ICD-10-CM

## 2019-07-21 DIAGNOSIS — G4733 Obstructive sleep apnea (adult) (pediatric): Secondary | ICD-10-CM

## 2019-07-21 DIAGNOSIS — R635 Abnormal weight gain: Secondary | ICD-10-CM

## 2019-07-21 DIAGNOSIS — J351 Hypertrophy of tonsils: Secondary | ICD-10-CM

## 2019-07-21 DIAGNOSIS — G4719 Other hypersomnia: Secondary | ICD-10-CM

## 2019-07-21 DIAGNOSIS — R351 Nocturia: Secondary | ICD-10-CM

## 2019-08-02 NOTE — Progress Notes (Signed)
Patient referred by Harrison Mons, PA, seen by me on 05/16/19, diagnostic PSG on 05/26/19.  Patient had a CPAP titration study on 07/21/19.  Please call and inform patient that I have entered an order for treatment with positive airway pressure (PAP) treatment for obstructive sleep apnea (OSA). He did well during the latest sleep study with BiPAP. We will, therefore, arrange for a machine for home use through a DME (durable medical equipment) company of His choice; and I will see the patient back in follow-up in about 10 weeks. Please also explain to the patient that I will be looking out for compliance data, which can be downloaded from the machine (stored on an SD card, that is inserted in the machine) or via remote access through a modem, that is built into the machine. At the time of the followup appointment we will discuss sleep study results and how it is going with PAP treatment at home. Please advise patient to bring His machine at the time of the first FU visit, even though this is cumbersome. Bringing the machine for every visit after that will likely not be needed, but often helps for the first visit to troubleshoot if needed. Please re-enforce the importance of compliance with treatment and the need for [place] to monitor compliance data - often an insurance requirement and actually good feedback for the patient as far as how they are doing.  Also remind patient, that any interim PAP machine or mask issues should be first addressed with the DME company, as they can often help better with technical and mask fit issues. Please ask if patient has a preference regarding DME company.  Please also make sure, the patient has a follow-up appointment with me in about 10 weeks from the setup date, thanks. May see one of our nurse practitioners if needed for proper timing of the FU appointment.  Please fax or rout report to the referring provider. Thanks,   [name], MD, PhD [facility]  [facility])

## 2019-08-02 NOTE — Addendum Note (Signed)
Addended by: [name] on: 08/02/2019 07:10 PM   Modules accepted: Orders

## 2019-08-02 NOTE — Procedures (Signed)
PATIENT'S NAME:  [name] [name], [name] DOB:      1981/09/02      MR#:    [id number]     DATE OF RECORDING: 07/21/2019 REFERRING M.D.:  [name] Oar, PA Study Performed:   CPAP  Titration HISTORY: 38 year old man with a history of paroxysmal SVT with status post ablation, and morbid obesity with a BMI of over 40, who presents for a full night titration study. His baseline sleep study on 05/26/19 showed moderate to severe obstructive sleep apnea, with a total AHI of 26.5/hour, REM AHI of 23.1/hour, supine AHI of 60.4/hour and O2 nadir of 81%. The patient endorsed the Epworth Sleepiness Scale at 10 points. The patient's weight 318 pounds with a height of 73 (inches), resulting in a BMI of 42.1 kg/m2. The patient's neck circumference measured 18.5 inches.  CURRENT MEDICATIONS: No current medications on file  PROCEDURE:  This is a multichannel digital polysomnogram utilizing the SomnoStar 11.2 system.  Electrodes and sensors were applied and monitored per AASM Specifications.   EEG, EOG, Chin and Limb EMG, were sampled at 200 Hz.  ECG, Snore and Nasal Pressure, Thermal Airflow, Respiratory Effort, CPAP Flow and Pressure, Oximetry was sampled at 50 Hz. Digital video and audio were recorded.      The patient was fitted with large F20 FFM. CPAP was initiated at 5 cmH20 with heated humidity per AASM standards and pressure was advanced to 14 cmH20. Due to residual snoring and for better tolerance, he was then switched to BiPAP of 15/11 cm at which point, his snoring drastically improved. His AHI was 0/hour with non-supine REM sleep achieved and O2 nadir of 93%.  Lights Out was at 20:43 and Lights On at 05:01. Total recording time (TRT) was 498.5 minutes, with a total sleep time (TST) of 468 minutes. The patient's sleep latency was 8.5 minutes. REM latency was 75 minutes.  The sleep efficiency was 93.9 %.    SLEEP ARCHITECTURE: WASO (Wake after sleep onset) was 26 minutes with minimal to mild sleep fragmentation  noted. There were 40 minutes in Stage N1, 301 minutes Stage N2, 5.5 minutes Stage N3 and 121.5 minutes in Stage REM.  The percentage of Stage N1 was 8.5%, Stage N2 was 64.3%, which is increased, Stage N3 was 1.2% and Stage R (REM sleep) was 26.%, which is slightly increased. The arousals were noted as: 47 were spontaneous, 0 were associated with PLMs, 8 were associated with respiratory events.  RESPIRATORY ANALYSIS:  There was a total of 11 respiratory events: 0 obstructive apneas, 0 central apneas and 0 mixed apneas with a total of 0 apneas and an apnea index (AI) of 0 /hour. There were 11 hypopneas with a hypopnea index of 1.4/hour. The patient also had 0 respiratory event related arousals (RERAs).      The total APNEA/HYPOPNEA INDEX  (AHI) was 1.4 /hour and the total RESPIRATORY DISTURBANCE INDEX was 1.4 /hour  1 events occurred in REM sleep and 10 events in NREM. The REM AHI was .5 /hour versus a non-REM AHI of 1.7 /hour.  The patient spent 230 minutes of total sleep time in the supine position and 238 minutes in non-supine. The supine AHI was 1.3, versus a non-supine AHI of 1.5.  OXYGEN SATURATION & C02:  The baseline 02 saturation was 95%, with the lowest being 92%. Time spent below 89% saturation equaled 0 minutes.  PERIODIC LIMB MOVEMENTS:  The patient had a total of 0 Periodic Limb Movements. The Periodic Limb Movement (PLM) index was  0 and the PLM Arousal index was 0 /hour.  Audio and video analysis did not show any abnormal or unusual movements, behaviors, phonations or vocalizations. The patient took 1 bathroom break. The EKG was in keeping with normal sinus rhythm (NSR).  Post-study, the patient indicated that sleep was better than usual.   IMPRESSION:   1. Obstructive Sleep Apnea (OSA) 2. Dysfunctions associated with sleep stages or arousal from sleep   RECOMMENDATIONS:   1. This study demonstrates resolution of the patient's obstructive sleep apnea with BiPAP therapy. I will,  therefore, start the patient on home PAP treatment at a pressure of 15/11 cm via large FFM with heated humidity. The patient should be reminded to be fully compliant with PAP therapy to improve sleep related symptoms and decrease long term cardiovascular risks. The patient should be reminded, that it may take up to 3 months to get fully used to using PAP with all planned sleep. The earlier full compliance is achieved, the better long term compliance tends to be. Please note that untreated obstructive sleep apnea may carry additional perioperative morbidity. Patients with significant obstructive sleep apnea should receive perioperative PAP therapy and the surgeons and particularly the anesthesiologist should be informed of the diagnosis and the severity of the sleep disordered breathing. 2. This study shows sleep fragmentation and abnormal sleep stage percentages; these are nonspecific findings and per se do not signify an intrinsic sleep disorder or a cause for the patient's sleep-related symptoms. Causes include (but are not limited to) the first night effect of the sleep study, circadian rhythm disturbances, medication effect or an underlying mood disorder or medical problem.  3. The patient should be cautioned not to drive, work at heights, or operate dangerous or heavy equipment when tired or sleepy. Review and reiteration of good sleep hygiene measures should be pursued with any patient. 4. The patient will be seen in follow-up in the sleep clinic at [facility] for discussion of the test results, symptom and treatment compliance review, further management strategies, etc. The referring provider will be notified of the test results.   I certify that I have reviewed the entire raw data recording prior to the issuance of this report in accordance with the Standards of Accreditation of the American Academy of Sleep Medicine (AASM)     [name], MD, PhD Diplomat, American Board of Neurology and Sleep Medicine  (Neurology and Sleep Medicine)

## 2019-08-07 ENCOUNTER — Telehealth: Payer: Self-pay | Admitting: Neurology

## 2019-08-07 NOTE — Telephone Encounter (Signed)
I called pt. I advised pt that Dr. Rexene Alberts reviewed their sleep study results and found that pt has sleep apnea and was best treated with Bipap. Dr. Rexene Alberts recommends that pt starts BiPAP at 15/11cm water pressure. I reviewed PAP compliance expectations with the pt. Pt is agreeable to starting a CPAP. I advised pt that an order will be sent to a DME, [facility], and [facility] will call the pt within about one week after they file with the pt's insurance. [facility] will show the pt how to use the machine, fit for masks, and troubleshoot the CPAP if needed. A follow up appt was made for insurance purposes with Ward Givens NP on Feb 23,2021 at 2:30 pm. Pt verbalized understanding to arrive 15 minutes early and bring their CPAP. A letter with all of this information in it will be mailed to the pt as a reminder. I verified with the pt that the address we have on file is correct. Pt verbalized understanding of results. Pt had no questions at this time but was encouraged to call back if questions arise. I have sent the order to [facility] and have received confirmation that they have received the order.

## 2019-08-07 NOTE — Telephone Encounter (Signed)
-----   Message from [name], MD sent at 08/02/2019  7:10 PM EST ----- Patient referred by Harrison Mons, PA, seen by me on 05/16/19, diagnostic PSG on 05/26/19.  Patient had a CPAP titration study on 07/21/19.  Please call and inform patient that I have entered an order for treatment with positive airway pressure (PAP) treatment for obstructive sleep apnea (OSA). He did well during the latest sleep study with BiPAP. We will, therefore, arrange for a machine for home use through a DME (durable medical equipment) company of His choice; and I will see the patient back in follow-up in about 10 weeks. Please also explain to the patient that I will be looking out for compliance data, which can be downloaded from the machine (stored on an SD card, that is inserted in the machine) or via remote access through a modem, that is built into the machine. At the time of the followup appointment we will discuss sleep study results and how it is going with PAP treatment at home. Please advise patient to bring His machine at the time of the first FU visit, even though this is cumbersome. Bringing the machine for every visit after that will likely not be needed, but often helps for the first visit to troubleshoot if needed. Please re-enforce the importance of compliance with treatment and the need for [place] to monitor compliance data - often an insurance requirement and actually good feedback for the patient as far as how they are doing.  Also remind patient, that any interim PAP machine or mask issues should be first addressed with the DME company, as they can often help better with technical and mask fit issues. Please ask if patient has a preference regarding DME company.  Please also make sure, the patient has a follow-up appointment with me in about 10 weeks from the setup date, thanks. May see one of our nurse practitioners if needed for proper timing of the FU appointment.  Please fax or rout report to the referring  provider. Thanks,   [name], MD, PhD [facility])

## 2019-08-08 NOTE — Telephone Encounter (Signed)
Letter printed, addressed to pt at address on file and placed with outgoing mail.

## 2019-10-31 ENCOUNTER — Ambulatory Visit: Payer: Self-pay | Admitting: Adult Health

## 2020-03-19 ENCOUNTER — Ambulatory Visit: Payer: Medicaid Other | Attending: Family

## 2020-03-19 DIAGNOSIS — Z23 Encounter for immunization: Secondary | ICD-10-CM

## 2020-03-19 NOTE — Progress Notes (Signed)
   Covid-19 Vaccination Clinic  Name:  [name] [name]    MRN: [id number] DOB: [birth year]  03/19/2020  [name] was observed post Covid-19 immunization for 15 minutes without incident. He was provided with Vaccine Information Sheet and instruction to access the V-Safe system.   [name] was instructed to call 911 with any severe reactions post vaccine: Marland Kitchen Difficulty breathing  . Swelling of face and throat  . A fast heartbeat  . A bad rash all over body  . Dizziness and weakness   Immunizations Administered    Name Date Dose VIS Date Route   Pfizer COVID-19 Vaccine 03/19/2020  1:09 PM 0.3 mL 11/01/2018 Intramuscular   Manufacturer: [employer], [employer]   Lot: [id number]   NDC: [address]-9

## 2020-04-09 ENCOUNTER — Ambulatory Visit: Payer: Medicaid Other | Attending: Critical Care Medicine

## 2020-04-09 DIAGNOSIS — Z23 Encounter for immunization: Secondary | ICD-10-CM

## 2020-04-09 NOTE — Progress Notes (Signed)
   Covid-19 Vaccination Clinic  Name:  Hicks Feick    MRN: [id number] DOB: 01-02-1981  04/09/2020  Mr. Safley was observed post Covid-19 immunization for 15 minutes without incident. He was provided with Vaccine Information Sheet and instruction to access the V-Safe system.   Mr. Racca was instructed to call 911 with any severe reactions post vaccine: Marland Kitchen Difficulty breathing  . Swelling of face and throat  . A fast heartbeat  . A bad rash all over body  . Dizziness and weakness   Immunizations Administered    Name Date Dose VIS Date Route   Pfizer COVID-19 Vaccine 04/09/2020 12:45 PM 0.3 mL 11/01/2018 Intramuscular   Manufacturer: [employer], [employer]   Lot: [id number]   NDC: [address]-5

## 2020-11-21 ENCOUNTER — Other Ambulatory Visit: Payer: Self-pay

## 2020-11-21 ENCOUNTER — Ambulatory Visit: Payer: BC Managed Care – PPO | Admitting: Podiatry

## 2020-11-21 DIAGNOSIS — L600 Ingrowing nail: Secondary | ICD-10-CM | POA: Diagnosis not present

## 2020-11-25 ENCOUNTER — Encounter: Payer: Self-pay | Admitting: Podiatry

## 2020-11-25 NOTE — Progress Notes (Signed)
  Subjective:  Patient ID: [name] [name], male    DOB: 02-Aug-1981,  MRN: [id number]  Chief Complaint  Patient presents with  . Nail Problem    Bilateral hallux nail growing in towards the skin. Possible ingrown     40 y.o. male presents with the above complaint. History confirmed with patient.   Objective:  Physical Exam: warm, good capillary refill, no trophic changes or ulcerative lesions, normal DP and PT pulses and normal sensory exam.  Bilateral hallux ingrown toenails without paronychia Assessment:   1. Ingrowing right great toenail   2. Ingrowing left great toenail      Plan:  Patient was evaluated and treated and all questions answered.    Ingrown Nail, bilaterally -Patient elects to proceed with minor surgery to remove ingrown toenail today. Consent reviewed and signed by patient. -Ingrown nail excised. See procedure note. -Educated on post-procedure care including soaking. Written instructions provided and reviewed. -Patient to follow up in 2 weeks for nail check.  Procedure: Excision of Ingrown Toenail Location: Bilateral 1st toe  nail . Anesthesia: Lidocaine 1% plain; 1.5 mL and Marcaine 0.5% plain; 1.5 mL, digital block. Skin Prep: Betadine. Dressing: Silvadene; telfa; dry, sterile, compression dressing. Technique: Following skin prep, the toe was exsanguinated and a tourniquet was secured at the base of the toe. The affected nail border was freed, split with a nail splitter, and excised. Chemical matrixectomy was then performed with phenol and irrigated out with alcohol. The tourniquet was then removed and sterile dressing applied. Disposition: Patient tolerated procedure well. Patient to return in 2 weeks for follow-up.      Return for nail re-check.

## 2020-12-09 ENCOUNTER — Ambulatory Visit: Payer: BC Managed Care – PPO | Admitting: Podiatry

## 2021-06-29 DIAGNOSIS — S83241A Other tear of medial meniscus, current injury, right knee, initial encounter: Secondary | ICD-10-CM | POA: Diagnosis not present

## 2021-07-03 DIAGNOSIS — M25561 Pain in right knee: Secondary | ICD-10-CM | POA: Diagnosis not present

## 2021-10-14 DIAGNOSIS — M25561 Pain in right knee: Secondary | ICD-10-CM | POA: Diagnosis not present

## 2021-10-14 DIAGNOSIS — M2391 Unspecified internal derangement of right knee: Secondary | ICD-10-CM | POA: Diagnosis not present

## 2021-10-16 DIAGNOSIS — M25561 Pain in right knee: Secondary | ICD-10-CM | POA: Diagnosis not present

## 2021-10-20 DIAGNOSIS — S83281A Other tear of lateral meniscus, current injury, right knee, initial encounter: Secondary | ICD-10-CM | POA: Diagnosis not present

## 2021-10-20 DIAGNOSIS — S83241A Other tear of medial meniscus, current injury, right knee, initial encounter: Secondary | ICD-10-CM | POA: Diagnosis not present

## 2021-10-20 DIAGNOSIS — M25561 Pain in right knee: Secondary | ICD-10-CM | POA: Diagnosis not present

## 2021-11-27 ENCOUNTER — Encounter (HOSPITAL_BASED_OUTPATIENT_CLINIC_OR_DEPARTMENT_OTHER): Payer: Self-pay | Admitting: Specialist

## 2021-11-28 ENCOUNTER — Other Ambulatory Visit: Payer: Self-pay

## 2021-11-28 ENCOUNTER — Encounter (HOSPITAL_BASED_OUTPATIENT_CLINIC_OR_DEPARTMENT_OTHER): Payer: Self-pay | Admitting: Specialist

## 2021-11-28 NOTE — H&P (View-Only) (Signed)
Spoke w/ via phone for pre-op interview--- pt ?Lab needs dos----  ask if need ekg (order entered)             ?Lab results------ no ?COVID test -----patient states asymptomatic no test needed ?Arrive at ------- 1215 on 12-02-2021 ?NPO after MN NO Solid Food.  Clear liquids from MN until--- 115 ?Med rec completed ?Medications to take morning of surgery ----- none ?Diabetic medication ----- n/a ?Patient instructed no nail polish to be worn day of surgery ?Patient instructed to bring photo id and insurance card day of surgery ?Patient aware to have Driver (ride ) / caregiver for 24 hours after surgery --wife, tianna ?Patient Special Instructions ----- asked to bring bipap/ mask/ tubing with him dos ans wife to hold onto in waiting room ?Pre-Op special Istructions ----- called and left message for dr collins OR scheduler, Kerri, requested orders ?Patient verbalized understanding of instructions that were given at this phone interview. ?Patient denies shortness of breath, chest pain, fever, cough at this phone interview.  ?

## 2021-11-28 NOTE — Progress Notes (Signed)
Spoke w/ via phone for pre-op interview--- pt ?Lab needs dos----  ask if need ekg (order entered)             ?Lab results------ no ?COVID test -----patient states asymptomatic no test needed ?Arrive at ------- 1215 on 12-02-2021 ?NPO after MN NO Solid Food.  Clear liquids from MN until--- 115 ?Med rec completed ?Medications to take morning of surgery ----- none ?Diabetic medication ----- n/a ?Patient instructed no nail polish to be worn day of surgery ?Patient instructed to bring photo id and insurance card day of surgery ?Patient aware to have Driver (ride ) / caregiver for 24 hours after surgery --wife, tianna ?Patient Special Instructions ----- asked to bring bipap/ mask/ tubing with him dos ans wife to hold onto in waiting room ?Pre-Op special Istructions ----- called and left message for dr Theda Sers OR scheduler, Marianna Fuss, requested orders ?Patient verbalized understanding of instructions that were given at this phone interview. ?Patient denies shortness of breath, chest pain, fever, cough at this phone interview.  ?

## 2021-12-02 ENCOUNTER — Ambulatory Visit (HOSPITAL_BASED_OUTPATIENT_CLINIC_OR_DEPARTMENT_OTHER)
Admission: RE | Admit: 2021-12-02 | Discharge: 2021-12-02 | Disposition: A | Discharge status: Home / Self Care | Payer: 59 | Attending: Specialist | Admitting: Specialist

## 2021-12-02 ENCOUNTER — Ambulatory Visit (HOSPITAL_BASED_OUTPATIENT_CLINIC_OR_DEPARTMENT_OTHER): Payer: 59 | Admitting: Anesthesiology

## 2021-12-02 ENCOUNTER — Encounter (HOSPITAL_BASED_OUTPATIENT_CLINIC_OR_DEPARTMENT_OTHER)
Admission: RE | Disposition: A | Discharge status: Home / Self Care | Payer: Self-pay | Source: Home / Self Care | Attending: Specialist

## 2021-12-02 ENCOUNTER — Other Ambulatory Visit: Payer: Self-pay

## 2021-12-02 ENCOUNTER — Encounter (HOSPITAL_BASED_OUTPATIENT_CLINIC_OR_DEPARTMENT_OTHER): Payer: Self-pay | Admitting: Specialist

## 2021-12-02 DIAGNOSIS — S83281A Other tear of lateral meniscus, current injury, right knee, initial encounter: Secondary | ICD-10-CM | POA: Insufficient documentation

## 2021-12-02 DIAGNOSIS — X58XXXA Exposure to other specified factors, initial encounter: Secondary | ICD-10-CM | POA: Insufficient documentation

## 2021-12-02 DIAGNOSIS — G4733 Obstructive sleep apnea (adult) (pediatric): Secondary | ICD-10-CM | POA: Insufficient documentation

## 2021-12-02 DIAGNOSIS — Z791 Long term (current) use of non-steroidal anti-inflammatories (NSAID): Secondary | ICD-10-CM | POA: Insufficient documentation

## 2021-12-02 DIAGNOSIS — M2241 Chondromalacia patellae, right knee: Secondary | ICD-10-CM | POA: Diagnosis not present

## 2021-12-02 DIAGNOSIS — M1711 Unilateral primary osteoarthritis, right knee: Secondary | ICD-10-CM | POA: Insufficient documentation

## 2021-12-02 DIAGNOSIS — G8918 Other acute postprocedural pain: Secondary | ICD-10-CM | POA: Diagnosis not present

## 2021-12-02 DIAGNOSIS — Z87891 Personal history of nicotine dependence: Secondary | ICD-10-CM | POA: Diagnosis not present

## 2021-12-02 DIAGNOSIS — S83231A Complex tear of medial meniscus, current injury, right knee, initial encounter: Secondary | ICD-10-CM

## 2021-12-02 DIAGNOSIS — S83241A Other tear of medial meniscus, current injury, right knee, initial encounter: Secondary | ICD-10-CM | POA: Insufficient documentation

## 2021-12-02 DIAGNOSIS — M2341 Loose body in knee, right knee: Secondary | ICD-10-CM

## 2021-12-02 DIAGNOSIS — Z9989 Dependence on other enabling machines and devices: Secondary | ICD-10-CM | POA: Diagnosis not present

## 2021-12-02 DIAGNOSIS — M94261 Chondromalacia, right knee: Secondary | ICD-10-CM | POA: Diagnosis not present

## 2021-12-02 HISTORY — DX: Unspecified tear of unspecified meniscus, current injury, right knee, initial encounter: S83.206A

## 2021-12-02 HISTORY — DX: Obstructive sleep apnea (adult) (pediatric): G47.33

## 2021-12-02 HISTORY — DX: Presence of spectacles and contact lenses: Z97.3

## 2021-12-02 HISTORY — PX: KNEE ARTHROSCOPY WITH MEDIAL MENISECTOMY: SHX5651

## 2021-12-02 SURGERY — ARTHROSCOPY, KNEE, WITH MEDIAL MENISCECTOMY
Anesthesia: Regional | Site: Knee | Laterality: Right

## 2021-12-02 MED ORDER — SODIUM CHLORIDE 0.9 % IR SOLN
Status: DC | PRN
  Administered 2021-12-02: 6000 mL

## 2021-12-02 MED ORDER — BUPIVACAINE HCL 0.25 % IJ SOLN
INTRAMUSCULAR | Status: DC | PRN
  Administered 2021-12-02: 20 mL

## 2021-12-02 MED ORDER — FENTANYL CITRATE (PF) 100 MCG/2ML IJ SOLN
INTRAMUSCULAR | Status: AC
  Filled 2021-12-02: qty 2

## 2021-12-02 MED ORDER — ONDANSETRON HCL 4 MG/2ML IJ SOLN
INTRAMUSCULAR | Status: AC
  Filled 2021-12-02: qty 2

## 2021-12-02 MED ORDER — DEXAMETHASONE SODIUM PHOSPHATE 10 MG/ML IJ SOLN
INTRAMUSCULAR | Status: AC
  Filled 2021-12-02: qty 1

## 2021-12-02 MED ORDER — ASPIRIN EC 81 MG PO TBEC: 81.0000 mg | DELAYED_RELEASE_TABLET | Freq: Two times a day (BID) | ORAL | 0 refills | Status: AC

## 2021-12-02 MED ORDER — FENTANYL CITRATE (PF) 100 MCG/2ML IJ SOLN
25.0000 ug | INTRAMUSCULAR | Status: DC | PRN
  Administered 2021-12-02: 25 ug via INTRAVENOUS
  Administered 2021-12-02: 50 ug via INTRAVENOUS
  Administered 2021-12-02: 25 ug via INTRAVENOUS

## 2021-12-02 MED ORDER — PHENYLEPHRINE 40 MCG/ML (10ML) SYRINGE FOR IV PUSH (FOR BLOOD PRESSURE SUPPORT)
PREFILLED_SYRINGE | INTRAVENOUS | Status: AC
  Filled 2021-12-02: qty 10

## 2021-12-02 MED ORDER — TRIAMCINOLONE ACETONIDE 40 MG/ML IJ SUSP
INTRAMUSCULAR | Status: DC | PRN
  Administered 2021-12-02: 80 mg via INTRAMUSCULAR

## 2021-12-02 MED ORDER — PROPOFOL 10 MG/ML IV BOLUS
INTRAVENOUS | Status: DC | PRN
  Administered 2021-12-02 (×2): 200 mg via INTRAVENOUS
  Administered 2021-12-02 (×2): 100 mg via INTRAVENOUS

## 2021-12-02 MED ORDER — KETOROLAC TROMETHAMINE 30 MG/ML IJ SOLN
INTRAMUSCULAR | Status: AC
Start: 2021-12-02 — End: ?
  Filled 2021-12-02: qty 1

## 2021-12-02 MED ORDER — DEXAMETHASONE SODIUM PHOSPHATE 10 MG/ML IJ SOLN
INTRAMUSCULAR | Status: DC | PRN
  Administered 2021-12-02: 10 mg via INTRAVENOUS

## 2021-12-02 MED ORDER — METHOCARBAMOL 500 MG PO TABS: 500.0000 mg | ORAL_TABLET | Freq: Four times a day (QID) | ORAL | 0 refills | Status: AC

## 2021-12-02 MED ORDER — ONDANSETRON HCL 4 MG/2ML IJ SOLN
INTRAMUSCULAR | Status: DC | PRN
  Administered 2021-12-02: 4 mg via INTRAVENOUS

## 2021-12-02 MED ORDER — PHENYLEPHRINE 40 MCG/ML (10ML) SYRINGE FOR IV PUSH (FOR BLOOD PRESSURE SUPPORT)
PREFILLED_SYRINGE | INTRAVENOUS | Status: DC | PRN
  Administered 2021-12-02 (×3): 80 ug via INTRAVENOUS

## 2021-12-02 MED ORDER — CEPHALEXIN 500 MG PO CAPS: 500.0000 mg | ORAL_CAPSULE | Freq: Four times a day (QID) | ORAL | 0 refills | Status: AC

## 2021-12-02 MED ORDER — SUCCINYLCHOLINE CHLORIDE 200 MG/10ML IV SOSY
PREFILLED_SYRINGE | INTRAVENOUS | Status: AC
  Filled 2021-12-02: qty 10

## 2021-12-02 MED ORDER — CEFAZOLIN SODIUM-DEXTROSE 2-4 GM/100ML-% IV SOLN
INTRAVENOUS | Status: AC
  Filled 2021-12-02: qty 100

## 2021-12-02 MED ORDER — LACTATED RINGERS IV SOLN: INTRAVENOUS | Status: DC

## 2021-12-02 MED ORDER — PROPOFOL 1000 MG/100ML IV EMUL
INTRAVENOUS | Status: AC
  Filled 2021-12-02: qty 100

## 2021-12-02 MED ORDER — MIDAZOLAM HCL 5 MG/5ML IJ SOLN
INTRAMUSCULAR | Status: DC | PRN
  Administered 2021-12-02: 2 mg via INTRAVENOUS

## 2021-12-02 MED ORDER — FENTANYL CITRATE (PF) 100 MCG/2ML IJ SOLN
INTRAMUSCULAR | Status: DC | PRN
  Administered 2021-12-02: 100 ug via INTRAVENOUS

## 2021-12-02 MED ORDER — LIDOCAINE HCL (CARDIAC) PF 100 MG/5ML IV SOSY
PREFILLED_SYRINGE | INTRAVENOUS | Status: DC | PRN
  Administered 2021-12-02: 40 mg via INTRAVENOUS

## 2021-12-02 MED ORDER — ACETAMINOPHEN 10 MG/ML IV SOLN
INTRAVENOUS | Status: AC
  Filled 2021-12-02: qty 100

## 2021-12-02 MED ORDER — CEFAZOLIN SODIUM-DEXTROSE 1-4 GM/50ML-% IV SOLN
INTRAVENOUS | Status: AC
  Filled 2021-12-02: qty 50

## 2021-12-02 MED ORDER — LIDOCAINE HCL (PF) 2 % IJ SOLN
INTRAMUSCULAR | Status: AC
Start: 2021-12-02 — End: ?
  Filled 2021-12-02: qty 5

## 2021-12-02 MED ORDER — OXYCODONE HCL 5 MG PO TABS: 5.0000 mg | ORAL_TABLET | Freq: Once | ORAL | Status: DC | PRN

## 2021-12-02 MED ORDER — OXYCODONE HCL 5 MG/5ML PO SOLN: 5.0000 mg | Freq: Once | ORAL | Status: DC | PRN

## 2021-12-02 MED ORDER — KETOROLAC TROMETHAMINE 30 MG/ML IJ SOLN
INTRAMUSCULAR | Status: DC | PRN
  Administered 2021-12-02: 30 mg via INTRAVENOUS

## 2021-12-02 MED ORDER — ACETAMINOPHEN 500 MG PO TABS: 1000.0000 mg | ORAL_TABLET | Freq: Once | ORAL | Status: DC | PRN

## 2021-12-02 MED ORDER — ONDANSETRON HCL 4 MG PO TABS: 4.0000 mg | ORAL_TABLET | Freq: Every day | ORAL | 1 refills | Status: AC | PRN

## 2021-12-02 MED ORDER — CEFAZOLIN IN SODIUM CHLORIDE 3-0.9 GM/100ML-% IV SOLN
3.0000 g | INTRAVENOUS | Status: AC
  Administered 2021-12-02: 3 g via INTRAVENOUS

## 2021-12-02 MED ORDER — OXYCODONE HCL 5 MG PO TABS: 5.0000 mg | ORAL_TABLET | ORAL | 0 refills | Status: AC | PRN

## 2021-12-02 MED ORDER — BUPIVACAINE-EPINEPHRINE (PF) 0.5% -1:200000 IJ SOLN
INTRAMUSCULAR | Status: DC | PRN
  Administered 2021-12-02: 25 mL via PERINEURAL

## 2021-12-02 MED ORDER — ACETAMINOPHEN 10 MG/ML IV SOLN
1000.0000 mg | Freq: Once | INTRAVENOUS | Status: DC | PRN
  Administered 2021-12-02: 1000 mg via INTRAVENOUS

## 2021-12-02 MED ORDER — MIDAZOLAM HCL 2 MG/2ML IJ SOLN
INTRAMUSCULAR | Status: AC
  Filled 2021-12-02: qty 2

## 2021-12-02 MED ORDER — SUCCINYLCHOLINE CHLORIDE 200 MG/10ML IV SOSY
PREFILLED_SYRINGE | INTRAVENOUS | Status: DC | PRN
  Administered 2021-12-02: 160 mg via INTRAVENOUS

## 2021-12-02 MED ORDER — ACETAMINOPHEN 160 MG/5ML PO SOLN: 1000.0000 mg | Freq: Once | ORAL | Status: DC | PRN

## 2021-12-02 MED ORDER — GLYCOPYRROLATE 0.2 MG/ML IJ SOLN
INTRAMUSCULAR | Status: DC | PRN
  Administered 2021-12-02: .2 mg via INTRAVENOUS

## 2021-12-02 SURGICAL SUPPLY — 41 items
BANDAGE ESMARK 6X9 LF (GAUZE/BANDAGES/DRESSINGS) ×1 IMPLANT
BLADE EXCALIBUR 4.0X13 (MISCELLANEOUS) ×2 IMPLANT
BNDG CMPR 9X6 STRL LF SNTH (GAUZE/BANDAGES/DRESSINGS) ×1
BNDG ESMARK 6X9 LF (GAUZE/BANDAGES/DRESSINGS) ×2
BNDG GAUZE ELAST 4 BULKY (GAUZE/BANDAGES/DRESSINGS) ×2 IMPLANT
CANISTER SUCT 1200ML W/VALVE (MISCELLANEOUS) ×2 IMPLANT
CNTNR URN SCR LID CUP LEK RST (MISCELLANEOUS) IMPLANT
CONT SPEC 4OZ STRL OR WHT (MISCELLANEOUS)
CUFF TOURN SGL QUICK 34 (TOURNIQUET CUFF) ×4
CUFF TRNQT CYL 34X4.125X (TOURNIQUET CUFF) ×2 IMPLANT
DRAPE ARTHROSCOPY W/POUCH 114 (DRAPES) ×2 IMPLANT
DRAPE INCISE IOBAN 66X45 STRL (DRAPES) ×2 IMPLANT
DRAPE U-SHAPE 47X51 STRL (DRAPES) ×2 IMPLANT
DRSG PAD ABDOMINAL 8X10 ST (GAUZE/BANDAGES/DRESSINGS) ×1 IMPLANT
DURAPREP 26ML APPLICATOR (WOUND CARE) ×2 IMPLANT
DW OUTFLOW CASSETTE/TUBE SET (MISCELLANEOUS) ×2 IMPLANT
EXCALIBUR 3.8MM X 13CM (MISCELLANEOUS) ×2 IMPLANT
GAUZE 4X4 16PLY ~~LOC~~+RFID DBL (SPONGE) ×2 IMPLANT
GAUZE SPONGE 4X4 12PLY STRL (GAUZE/BANDAGES/DRESSINGS) ×2 IMPLANT
GAUZE SPONGE 4X4 12PLY STRL LF (GAUZE/BANDAGES/DRESSINGS) ×1 IMPLANT
GAUZE XEROFORM 1X8 LF (GAUZE/BANDAGES/DRESSINGS) ×2 IMPLANT
GLOVE SRG 8 PF TXTR STRL LF DI (GLOVE) ×1 IMPLANT
GLOVE SURG ENC MOIS LTX SZ7 (GLOVE) ×2 IMPLANT
GLOVE SURG ENC MOIS LTX SZ8 (GLOVE) ×2 IMPLANT
GLOVE SURG UNDER POLY LF SZ7.5 (GLOVE) ×2 IMPLANT
GLOVE SURG UNDER POLY LF SZ8 (GLOVE) ×2
GOWN STRL REUS W/TWL XL LVL3 (GOWN DISPOSABLE) ×4 IMPLANT
IV NS IRRIG 3000ML ARTHROMATIC (IV SOLUTION) ×4 IMPLANT
KIT TURNOVER CYSTO (KITS) ×2 IMPLANT
MANIFOLD NEPTUNE II (INSTRUMENTS) ×2 IMPLANT
NEEDLE HYPO 22GX1.5 SAFETY (NEEDLE) ×2 IMPLANT
PACK ARTHROSCOPY DSU (CUSTOM PROCEDURE TRAY) ×2 IMPLANT
PACK BASIN DAY SURGERY FS (CUSTOM PROCEDURE TRAY) ×2 IMPLANT
PAD ARMBOARD 7.5X6 YLW CONV (MISCELLANEOUS) IMPLANT
SUT ETHILON 4 0 PS 2 18 (SUTURE) ×2 IMPLANT
SYR CONTROL 10ML LL (SYRINGE) ×2 IMPLANT
TOWEL OR 17X26 10 PK STRL BLUE (TOWEL DISPOSABLE) ×4 IMPLANT
TUBE CONNECTING 12X1/4 (SUCTIONS) ×2 IMPLANT
TUBING ARTHROSCOPY IRRIG 16FT (MISCELLANEOUS) ×2 IMPLANT
WAND APOLLORF SJ50 AR-9845 (SURGICAL WAND) ×2 IMPLANT
WATER STERILE IRR 500ML POUR (IV SOLUTION) ×2 IMPLANT

## 2021-12-02 NOTE — Discharge Instructions (Addendum)
Right knee arthroscopy, Partial meniscectomy and chondroplasty: ?-Please take aspirin 81 mg twice a day 1 in the morning 1 in the evening starting the morning after surgery ?-Okay to take Tylenol 1000 mg 2-3 times a day with pain medication ?-Please ice and elevate the leg ?-Okay to start physical therapy 1 week after surgery  ?-Please follow-up in the office in 2 weeks ?-Please take a stool softener while taking opioid pain medication ?-Please take a probiotic while taking antibiotics for 3 days ?-Okay to remove bandages in 3 days and replace with bandaids, keep incision site clean  ?-Please wear compression stocking on operative leg for 1 week after surgery  ?-Okay to come out of compression stocking on non-operative leg 3 days after surgery ? ? ?Regional Anesthesia Blocks ? ?1. Numbness or the inability to move the "blocked" extremity may last from 3-48 hours after placement. The length of time depends on the medication injected and your individual response to the medication. If the numbness is not going away after 48 hours, call your surgeon. ? ?2. The extremity that is blocked will need to be protected until the numbness is gone and the  Strength has returned. Because you cannot feel it, you will need to take extra care to avoid injury. Because it may be weak, you may have difficulty moving it or using it. You may not know what position it is in without looking at it while the block is in effect. ? ?3. For blocks in the legs and feet, returning to weight bearing and walking needs to be done carefully. You will need to wait until the numbness is entirely gone and the strength has returned. You should be able to move your leg and foot normally before you try and bear weight or walk. You will need someone to be with you when you first try to ensure you do not fall and possibly risk injury. ? ?4. Bruising and tenderness at the needle site are common side effects and will resolve in a few days. ? ?5. Persistent  numbness or new problems with movement should be communicated to the surgeon or the [facility] [phone] [facility] [phone]).  ? ?Post Anesthesia Home Care Instructions ? ?Activity: ?Get plenty of rest for the remainder of the day. A responsible individual must stay with you for 24 hours following the procedure.  ?For the next 24 hours, DO NOT: ?-Drive a car ?-Advertising copywriter ?-Drink alcoholic beverages ?-Take any medication unless instructed by your physician ?-Make any legal decisions or sign important papers. ? ?Meals: ?Start with liquid foods such as gelatin or soup. Progress to regular foods as tolerated. Avoid greasy, spicy, heavy foods. If nausea and/or vomiting occur, drink only clear liquids until the nausea and/or vomiting subsides. Call your physician if vomiting continues. ? ?Special Instructions/Symptoms: ?Your throat may feel dry or sore from the anesthesia or the breathing tube placed in your throat during surgery. If this causes discomfort, gargle with warm salt water. The discomfort should disappear within 24 hours. ? ?If you had a scopolamine patch placed behind your ear for the management of post- operative nausea and/or vomiting: ? ?1. The medication in the patch is effective for 72 hours, after which it should be removed.  Wrap patch in a tissue and discard in the trash. Wash hands thoroughly with soap and water. ?2. You may remove the patch earlier than 72 hours if you experience unpleasant side effects which may include dry mouth, dizziness  or visual disturbances. ?3. Avoid touching the patch. Wash your hands with soap and water after contact with the patch. ? ?No ibuprofen/Motrin/Advil until 8:20pm or after ? ? ?

## 2021-12-02 NOTE — Anesthesia Procedure Notes (Signed)
Procedure Name: LMA Insertion ?Date/Time: 12/02/2021 1:32 PM ?Performed by: Lieutenant Diego, CRNA ?Pre-anesthesia Checklist: Patient identified, Emergency Drugs available, Suction available and Patient being monitored ?Patient Re-evaluated:Patient Re-evaluated prior to induction ?Oxygen Delivery Method: Circle system utilized ?Preoxygenation: Pre-oxygenation with 100% oxygen ?Induction Type: IV induction ?Ventilation: Mask ventilation without difficulty ?LMA: LMA inserted ?LMA Size: 5.0 ?Number of attempts: 1 ?Airway Equipment and Method: Bite block ?Placement Confirmation: positive ETCO2 ?Tube secured with: Tape ?Dental Injury: Teeth and Oropharynx as per pre-operative assessment  ?Comments: Positional LMA, removed and mask vent   ? ? ? ? ?

## 2021-12-02 NOTE — Anesthesia Preprocedure Evaluation (Signed)
Anesthesia Evaluation  ?Patient identified by MRN, date of birth, ID band ?Patient awake ? ? ? ?Reviewed: ?Allergy & Precautions, NPO status , Patient's Chart, lab work & pertinent test results ? ?Airway ?Mallampati: IV ? ?TM Distance: >3 FB ?Neck ROM: Full ? ? ? Dental ? ?(+) Teeth Intact, Dental Advisory Given ?  ?Pulmonary ?neg shortness of breath, sleep apnea, Continuous Positive Airway Pressure Ventilation and Oxygen sleep apnea , neg COPD, neg recent URI, former smoker,  ?BiPAP with O2 ?  ?breath sounds clear to auscultation ? ? ? ? ? ? Cardiovascular ?negative cardio ROS ? ? ?Rhythm:Regular  ? ?  ?Neuro/Psych ?negative neurological ROS ? negative psych ROS  ? GI/Hepatic ?negative GI ROS, Neg liver ROS,   ?Endo/Other  ?Morbid obesityLab Results ?     Component                Value               Date                 ?     HGBA1C                   6.1 (H)             09/15/2016           ? ? Renal/GU ?negative Renal ROS  ? ?  ?Musculoskeletal ?Right knee torn medial and lateral meniscus, chondral flaps  ? Abdominal ?  ?Peds ?negative pediatric ROS ?(+)  Hematology ?negative hematology ROS ?(+)   ?Anesthesia Other Findings ? ? Reproductive/Obstetrics ? ?  ? ? ? ? ? ? ? ? ? ? ? ? ? ?  ?  ? ? ? ? ? ? ? ? ?Anesthesia Physical ?Anesthesia Plan ? ?ASA: 3 ? ?Anesthesia Plan: General and Regional  ? ?Post-op Pain Management: Toradol IV (intra-op)*, Regional block* and Tylenol PO (pre-op)*  ? ?Induction: Intravenous ? ?PONV Risk Score and Plan: 2 and Ondansetron and Dexamethasone ? ?Airway Management Planned: LMA and Oral ETT ? ?Additional Equipment: None ? ?Intra-op Plan:  ? ?Post-operative Plan: Extubation in OR ? ?Informed Consent: I have reviewed the patients History and Physical, chart, labs and discussed the procedure including the risks, benefits and alternatives for the proposed anesthesia with the patient or authorized representative who has indicated his/her understanding and  acceptance.  ? ? ? ?Dental advisory given ? ?Plan Discussed with: CRNA and Anesthesiologist ? ?Anesthesia Plan Comments:   ? ? ? ? ? ? ?Anesthesia Quick Evaluation ? ?

## 2021-12-02 NOTE — Anesthesia Procedure Notes (Signed)
Procedure Name: LMA Insertion ?Date/Time: 12/02/2021 1:35 PM ?Performed by: Caren Macadam, CRNA ?Pre-anesthesia Checklist: Patient identified, Emergency Drugs available, Suction available and Patient being monitored ?Patient Re-evaluated:Patient Re-evaluated prior to induction ?Oxygen Delivery Method: Circle System Utilized ?Preoxygenation: Pre-oxygenation with 100% oxygen ?Induction Type: IV induction ?Ventilation: Mask ventilation without difficulty ?LMA: LMA with gastric port inserted ?LMA Size: 5.0 ?Number of attempts: 1 ?Placement Confirmation: positive ETCO2 ?Tube secured with: Tape ?Dental Injury: Teeth and Oropharynx as per pre-operative assessment  ?Comments: Couldn't move adequate TV. Mask vent with ease. Plan to intubate. ? ? ? ? ?

## 2021-12-02 NOTE — Anesthesia Procedure Notes (Signed)
Anesthesia Regional Block: Adductor canal block  ? ?Pre-Anesthetic Checklist: , timeout performed,  Correct Patient, Correct Site, Correct Laterality,  Correct Procedure, Correct Position, site marked,  Risks and benefits discussed,  Surgical consent,  Pre-op evaluation,  At surgeon's request and post-op pain management ? ?Laterality: Right and Lower ? ?Prep: chloraprep     ?  ?Needles:  ?Injection technique: Single-shot ? ?  ? ? ?Needle Length: 9cm  ?Needle Gauge: 22  ? ? ? ?Additional Needles: ?Arrow? StimuQuik? ECHO Echogenic Stimulating PNB Needle ? ?Procedures:,,,, ultrasound used (permanent image in chart),,    ?Narrative:  ?Start time: 12/02/2021 1:46 PM ?End time: 12/02/2021 1:50 PM ?Injection made incrementally with aspirations every 5 mL. ? ?Performed by: Personally  ?Anesthesiologist: Oleta Mouse, MD ? ? ? ? ?

## 2021-12-02 NOTE — Transfer of Care (Signed)
Immediate Anesthesia Transfer of Care Note ? ?Patient: [name] [name] ? ?Procedure(s) Performed: KNEE ARTHROSCOPY WITH PARTIAL MEDIAL MENISECTOMY, CHONDROPLASTY (Right: Knee) ? ?Patient Location: PACU ? ?Anesthesia Type:General ? ?Level of Consciousness: awake ? ?Airway & Oxygen Therapy: Patient Spontanous Breathing and Patient connected to face mask oxygen ? ?Post-op Assessment: Report given to RN and Post -op Vital signs reviewed and stable ? ?Post vital signs: Reviewed and stable ? ?Last Vitals:  ?Vitals Value Taken Time  ?BP 137/65 12/02/21 1437  ?Temp    ?Pulse 97 12/02/21 1440  ?Resp 13 12/02/21 1440  ?SpO2 99 % 12/02/21 1440  ?Vitals shown include unvalidated device data. ? ?Last Pain:  ?Vitals:  ? 12/02/21 1230  ?TempSrc: Oral  ?PainSc: 7   ?   ? ?Patients Stated Pain Goal: 5 (12/02/21 1230) ? ?Complications: No notable events documented. ?

## 2021-12-02 NOTE — Op Note (Signed)
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 ?Procedure #1 right knee arthroscopic partial meniscectomy and #2 arthroscopic chondroplasty femoral trochlea medial femoral condyle lateral to plateau and removal of small chondral loose bodies. ?Surgeon Valma Cava, MD ?Assistant Harl Favor, PA-C ?Anesthesia abductor block with General with intraoperative knee block ?Estimated blood loss minimal ?Complications none ?Disposition PACU stable ?Drains none ?Tourniquet time none ?Operative details ?Patient was encountered in the holding area correct side marked and signed appropriately.  IV antibiotics were given within one 1 hour of the surgical incision time.  Adductor  block was administered per anesthesiologist.  TED hose was applied to uninvolved leg taken operating room placed supine position under general anesthesia.  Following this right placed in thigh holder prepped with DuraPrep and draped into sterile fashion.  Timeout done confirm the right side arthroscopic portals were established inferomedial inferolateral diagnostic arthroscopy patellofemoral joint revealed normal tracking with there was grade 2 combination of patella grade 3 through trochlea with some unstable chondral flaps on the femoral trochlea and a very light chondroplasty was performed with a shaver.  ACL PCL were intact.  Medial compartment was inspected grade III chondromalacia medial femoral condyle unstable chondral flaps light shaving chondroplasty was performed.  Medial meniscus showed a large complex tear involving the posterior one half of the large pedunculated flap.  Utilizing baskets motorized shaver and cautery a partial medial meniscectomy was done probably  beveled and contoured.  Suture passed gutters unremarkable also during the meniscectomy the multiple chondral loose bodies were sequentially encountered and removed.  Swept to the lateral side or to cartilage showed grade 2 changes lateral to plateau and a very light chondroplasty was performed with a shaver.  Lateral meniscus was completely intact lateral femoral condyle unremarkable.  There is no other abnormalities noted.  Irrigated arthroscopic clip was removed.  Portals were closed with 4 nylon suture.  Sterile dressing applied soft bulky bandage TED hose.  Awakened taken operating PACU stable condition.  Ice pack will be applied.  Patient will be stabilized in PACU discharged home follow-up in office in the and then start therapy next week.  Prognosis is good. ?

## 2021-12-02 NOTE — H&P (Signed)
[name] [name] is an 41 y.o. male.   ?Chief Complaint: Right knee pain ?HPI:  ?This is a very pleasant 41 year old male who has been having right knee pain for many years now.  He denies any injury that he can think of that started the knee pain.  He has had a cortisone injection with minimal relief.  MRI scan revealed a medial and lateral meniscal tear.  He has tried over-the-counter medications, anti-inflammatories rest icing and elevating.  Patient states that he is having pain throughout the entirety of the knee. ? ?Past Medical History:  ?Diagnosis Date  ? OSA treated with BiPAP   ? study in epic 07-21-2019 with Dr Kathie Rhodes. [place],  pt stated uses nightly  ? Paroxysmal supraventricular tachycardia (HCC) 01/01/2016  ? s/p ablation;   (11-28-2021  pt has not had any break through SVT or issues since ablation,  has not seen cardiology since 2017)  ? Right knee meniscal tear   ? Wears contact lenses   ? ? ?Past Surgical History:  ?Procedure Laterality Date  ? CARDIAC ELECTROPHYSIOLOGY MAPPING AND ABLATION  01/01/2016  ? @[facility]  by dr . beaty;     successful AVNRT ablation  ? INGUINAL HERNIA REPAIR Left 06/13/2019  ? @[facility]   ? SHOULDER ARTHROSCOPY W/ LABRAL REPAIR Right 04/15/2016  ? Dr.  ? ? ?Family History  ?Problem Relation Age of Onset  ? Diabetes Mother   ? Diabetes Father   ? Diabetes Brother   ? ?Social History:  reports that he quit smoking about 14 months ago. His smoking use included cigarettes. He has never used smokeless tobacco. He reports that he does not currently use alcohol. He reports that he does not use drugs. ? ?Allergies: No Known Allergies ? ?Medications Prior to Admission  ?Medication Sig Dispense Refill  ? Meloxicam (MOBIC PO) Take by mouth as needed.    ? ? ?No results found for this or any previous visit (from the past 48 hour(s)). ?No results found. ? ?Review of Systems  ?All other systems reviewed and are negative. ? ?Blood pressure (!) 168/98, pulse 86, temperature 98.3 ?F (36.8 ?C),  temperature source Oral, resp. rate 18, height 6\' 1"  (1.854 m), weight (!) 141.5 kg, SpO2 97 %. ?Physical Exam ?Constitutional:   ?   Appearance: Normal appearance.  ?HENT:  ?   Head: Normocephalic and atraumatic.  ?Cardiovascular:  ?   Rate and Rhythm: Normal rate and regular rhythm.  ?Pulmonary:  ?   Effort: Pulmonary effort is normal.  ?   Breath sounds: Normal breath sounds.  ?Musculoskeletal:  ?   Comments: His right knee shows normal clinical alignment. A 1+ to 2 - effusion. No ligamentous instability to collateral or cruciate testing. Tenderness in the medial joint line to palpation with pain on medial McMurray's but no click or clunk. No patellofemoral crepitation. Sensation and circulation are intact.  ?Skin: ?   General: Skin is warm and dry.  ?   Capillary Refill: Capillary refill takes less than 2 seconds.  ?Neurological:  ?   General: No focal deficit present.  ?   Mental Status: He is alert and oriented to person, place, and time.  ?Psychiatric:     ?   Mood and Affect: Mood normal.     ?   Behavior: Behavior normal.     ?   Thought Content: Thought content normal.     ?   Judgment: Judgment normal.  ?  ? 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  Risks and benefits of surgery were discussed with the patient in the office.  Surgical versus nonsurgical management was discussed.  Patient has elected to proceed with surgical management at this time.  He will be having a right knee arthroscopy partial medial meniscectomy, partial lateral meniscectomy and chondroplasty.  He will follow-up back in the office 2 weeks after surgery.  All questions encouraged and answered for the patient today. ? ?Cherie Dark, PA ?12/02/2021, 1:01 PM ? ? ? ?

## 2021-12-02 NOTE — Anesthesia Procedure Notes (Signed)
Procedure Name: Intubation ?Date/Time: 12/02/2021 1:40 PM ?Performed by: Lieutenant Diego, CRNA ?Pre-anesthesia Checklist: Patient identified, Emergency Drugs available, Suction available and Patient being monitored ?Patient Re-evaluated:Patient Re-evaluated prior to induction ?Oxygen Delivery Method: Circle system utilized ?Preoxygenation: Pre-oxygenation with 100% oxygen ?Induction Type: IV induction ?Ventilation: Mask ventilation without difficulty ?Laryngoscope Size: Sabra Heck, 2, Mac and 4 ?Grade View: Grade II ?Tube type: Oral ?Tube size: 7.5 mm ?Number of attempts: 2 ?Airway Equipment and Method: Stylet and Oral airway ?Placement Confirmation: ETT inserted through vocal cords under direct vision, positive ETCO2 and breath sounds checked- equal and bilateral ?Secured at: 24 cm ?Tube secured with: Tape ?Dental Injury: Teeth and Oropharynx as per pre-operative assessment  ?Comments: Unable to view cords with Mil 2. Moser used MAC 4 with grade one view. AOI, +etco2, =BBS. ? ? ? ? ?

## 2021-12-02 NOTE — Interval H&P Note (Signed)
History and Physical Interval Note: ? ?12/02/2021 ?1:20 PM ? ?[name] [name]  has presented today for surgery, with the diagnosis of Right knee torn medial and lateral meniscus, chondral flaps.  The various methods of treatment have been discussed with the patient and family. After consideration of risks, benefits and other options for treatment, the patient has consented to  Procedure(s) with comments: ?KNEE ARTHROSCOPY WITH PARTIAL MEDIAL AND LATERAL  MENISECTOMY, CHONDROPLASTY (Right) - WITH KNEE BLOCK  ?60 MIN as a surgical intervention.  The patient's history has been reviewed, patient examined, no change in status, stable for surgery.  I have reviewed the patient's chart and labs.  Questions were answered to the patient's satisfaction.   ? ? ?[name] ? ? ?

## 2021-12-03 ENCOUNTER — Encounter (HOSPITAL_BASED_OUTPATIENT_CLINIC_OR_DEPARTMENT_OTHER): Payer: Self-pay | Admitting: Specialist

## 2021-12-03 NOTE — Anesthesia Postprocedure Evaluation (Signed)
Anesthesia Post Note ? ?Patient: [name] [name] ? ?Procedure(s) Performed: KNEE ARTHROSCOPY WITH PARTIAL MEDIAL MENISECTOMY, CHONDROPLASTY (Right: Knee) ? ?  ? ?Patient location during evaluation: PACU ?Anesthesia Type: Regional and General ?Level of consciousness: awake and alert ?Pain management: pain level controlled ?Vital Signs Assessment: post-procedure vital signs reviewed and stable ?Respiratory status: spontaneous breathing, nonlabored ventilation, respiratory function stable and patient connected to nasal cannula oxygen ?Cardiovascular status: blood pressure returned to baseline and stable ?Postop Assessment: no apparent nausea or vomiting ?Anesthetic complications: no ? ? ?No notable events documented. ? ?Last Vitals:  ?Vitals:  ? 12/02/21 1530 12/02/21 1605  ?BP: 133/79 (!) 142/87  ?Pulse: 73 74  ?Resp: 16 16  ?Temp: 36.7 ?C 36.7 ?C  ?SpO2: 99% 98%  ?  ?Last Pain:  ?Vitals:  ? 12/02/21 1605  ?TempSrc:   ?PainSc: 3   ? ?Pain Goal: Patients Stated Pain Goal: 5 (12/02/21 1515) ? ?  ?  ?  ?  ?  ?  ?  ? ?[name] [name] ? ? ? ? ?

## 2021-12-23 DIAGNOSIS — M25561 Pain in right knee: Secondary | ICD-10-CM | POA: Diagnosis not present

## 2022-08-25 DIAGNOSIS — E559 Vitamin D deficiency, unspecified: Secondary | ICD-10-CM | POA: Diagnosis not present

## 2022-08-25 DIAGNOSIS — R5383 Other fatigue: Secondary | ICD-10-CM | POA: Diagnosis not present

## 2022-08-25 DIAGNOSIS — Z125 Encounter for screening for malignant neoplasm of prostate: Secondary | ICD-10-CM | POA: Diagnosis not present

## 2022-08-25 DIAGNOSIS — Z0000 Encounter for general adult medical examination without abnormal findings: Secondary | ICD-10-CM | POA: Diagnosis not present

## 2022-08-25 DIAGNOSIS — Z1159 Encounter for screening for other viral diseases: Secondary | ICD-10-CM | POA: Diagnosis not present

## 2022-09-08 DIAGNOSIS — R7401 Elevation of levels of liver transaminase levels: Secondary | ICD-10-CM | POA: Diagnosis not present

## 2023-01-21 DIAGNOSIS — E1169 Type 2 diabetes mellitus with other specified complication: Secondary | ICD-10-CM | POA: Diagnosis not present

## 2023-01-21 DIAGNOSIS — E559 Vitamin D deficiency, unspecified: Secondary | ICD-10-CM | POA: Diagnosis not present

## 2023-01-21 DIAGNOSIS — Z79899 Other long term (current) drug therapy: Secondary | ICD-10-CM | POA: Diagnosis not present

## 2023-02-27 DIAGNOSIS — R000 Tachycardia, unspecified: Secondary | ICD-10-CM | POA: Diagnosis not present

## 2023-02-27 DIAGNOSIS — R112 Nausea with vomiting, unspecified: Secondary | ICD-10-CM | POA: Diagnosis not present

## 2023-02-27 DIAGNOSIS — T675XXA Heat exhaustion, unspecified, initial encounter: Secondary | ICD-10-CM | POA: Diagnosis not present

## 2023-03-02 ENCOUNTER — Emergency Department (HOSPITAL_COMMUNITY)
Admission: EM | Admit: 2023-03-02 | Discharge: 2023-03-02 | Disposition: A | Discharge status: Home / Self Care | Payer: 59 | Attending: Emergency Medicine | Admitting: Emergency Medicine

## 2023-03-02 ENCOUNTER — Emergency Department (HOSPITAL_COMMUNITY): Payer: 59

## 2023-03-02 ENCOUNTER — Other Ambulatory Visit: Payer: Self-pay

## 2023-03-02 ENCOUNTER — Encounter (HOSPITAL_COMMUNITY): Payer: Self-pay

## 2023-03-02 DIAGNOSIS — R1110 Vomiting, unspecified: Secondary | ICD-10-CM | POA: Diagnosis not present

## 2023-03-02 DIAGNOSIS — R197 Diarrhea, unspecified: Secondary | ICD-10-CM | POA: Diagnosis not present

## 2023-03-02 DIAGNOSIS — R1030 Lower abdominal pain, unspecified: Secondary | ICD-10-CM | POA: Insufficient documentation

## 2023-03-02 DIAGNOSIS — R102 Pelvic and perineal pain: Secondary | ICD-10-CM | POA: Diagnosis not present

## 2023-03-02 DIAGNOSIS — E119 Type 2 diabetes mellitus without complications: Secondary | ICD-10-CM | POA: Diagnosis not present

## 2023-03-02 DIAGNOSIS — R112 Nausea with vomiting, unspecified: Secondary | ICD-10-CM | POA: Diagnosis not present

## 2023-03-02 DIAGNOSIS — K4090 Unilateral inguinal hernia, without obstruction or gangrene, not specified as recurrent: Secondary | ICD-10-CM | POA: Diagnosis not present

## 2023-03-02 DIAGNOSIS — R1032 Left lower quadrant pain: Secondary | ICD-10-CM | POA: Diagnosis not present

## 2023-03-02 LAB — CBC
HCT: 40.9 % (ref 39.0–52.0)
Hemoglobin: 13.2 g/dL (ref 13.0–17.0)
MCH: 27.3 pg (ref 26.0–34.0)
MCHC: 32.3 g/dL (ref 30.0–36.0)
MCV: 84.5 fL (ref 80.0–100.0)
Platelets: 264 10*3/uL (ref 150–400)
RBC: 4.84 MIL/uL (ref 4.22–5.81)
RDW: 14.6 % (ref 11.5–15.5)
WBC: 7.7 10*3/uL (ref 4.0–10.5)
nRBC: 0 % (ref 0.0–0.2)

## 2023-03-02 LAB — URINALYSIS, ROUTINE W REFLEX MICROSCOPIC
Bilirubin Urine: NEGATIVE
Glucose, UA: NEGATIVE mg/dL
Ketones, ur: NEGATIVE mg/dL
Leukocytes,Ua: NEGATIVE
Nitrite: NEGATIVE
Protein, ur: NEGATIVE mg/dL
Specific Gravity, Urine: 1.024 (ref 1.005–1.030)
pH: 5 (ref 5.0–8.0)

## 2023-03-02 LAB — COMPREHENSIVE METABOLIC PANEL
ALT: 36 U/L (ref 0–44)
AST: 29 U/L (ref 15–41)
Albumin: 3.9 g/dL (ref 3.5–5.0)
Alkaline Phosphatase: 56 U/L (ref 38–126)
Anion gap: 7 (ref 5–15)
BUN: 12 mg/dL (ref 6–20)
CO2: 22 mmol/L (ref 22–32)
Calcium: 8.8 mg/dL — ABNORMAL LOW (ref 8.9–10.3)
Chloride: 110 mmol/L (ref 98–111)
Creatinine, Ser: 0.76 mg/dL (ref 0.61–1.24)
GFR, Estimated: >60 mL/min (ref >60)
Glucose, Bld: 93 mg/dL (ref 70–99)
Potassium: 3.6 mmol/L (ref 3.5–5.1)
Sodium: 139 mmol/L (ref 135–145)
Total Bilirubin: 0.9 mg/dL (ref 0.3–1.2)
Total Protein: 7.5 g/dL (ref 6.5–8.1)

## 2023-03-02 LAB — CK: Total CK: 982 U/L — ABNORMAL HIGH (ref 49–397)

## 2023-03-02 LAB — LIPASE, BLOOD: Lipase: 32 U/L (ref 11–51)

## 2023-03-02 LAB — LACTIC ACID, PLASMA: Lactic Acid, Venous: 0.8 mmol/L (ref 0.5–1.9)

## 2023-03-02 LAB — CBG MONITORING, ED: Glucose-Capillary: 84 mg/dL (ref 70–99)

## 2023-03-02 MED ORDER — IOHEXOL 300 MG/ML  SOLN
100.0000 mL | Freq: Once | INTRAMUSCULAR | Status: AC | PRN
  Administered 2023-03-02: 100 mL via INTRAVENOUS

## 2023-03-02 MED ORDER — LACTATED RINGERS IV BOLUS
500.0000 mL | Freq: Once | INTRAVENOUS | Status: AC
  Administered 2023-03-02: 500 mL via INTRAVENOUS

## 2023-03-02 MED ORDER — LACTATED RINGERS IV BOLUS
1000.0000 mL | Freq: Once | INTRAVENOUS | Status: AC
  Administered 2023-03-02: 1000 mL via INTRAVENOUS

## 2023-03-02 MED ORDER — ONDANSETRON HCL 4 MG/2ML IJ SOLN
4.0000 mg | Freq: Once | INTRAMUSCULAR | Status: AC | PRN
  Administered 2023-03-02: 4 mg via INTRAVENOUS
  Filled 2023-03-02: qty 2

## 2023-03-02 MED ORDER — ONDANSETRON HCL 4 MG PO TABS: 4.0000 mg | ORAL_TABLET | Freq: Four times a day (QID) | ORAL | 0 refills | Status: DC | PRN

## 2023-03-02 MED ORDER — ONDANSETRON HCL 4 MG PO TABS: 4.0000 mg | ORAL_TABLET | Freq: Four times a day (QID) | ORAL | 0 refills | Status: AC | PRN

## 2023-03-02 NOTE — ED Triage Notes (Signed)
Pt arrived via POV. C/o N/V/D since Saturday when they were treated for heat exhaustion. Pt has R sided inguinal hernia that has been getting pushed out since they have been vomiting. C/o pain in region.  AOX4

## 2023-03-02 NOTE — ED Provider Notes (Addendum)
[name] EMERGENCY DEPARTMENT AT [facility] Provider Note   CSN: [id number] Arrival date & time: 03/02/23  0160     History  Chief Complaint  Patient presents with   Nausea    eme   Emesis   Inguinal Hernia   Diarrhea    [name] [name] is a 42 y.o. male with a history of inguinal hernia s/p repair, T2DM (on Mounjaro) presenting with nausea, vomiting and left lower abdominal pain. 3 days ago, he was treated for heat exhaustion and had a few episodes of vomiting and diarrhea.  His symptoms improved the following day.  This morning, he drank few sips of water and had immediate vomiting. Reports that his hernia pushes out, and goes down into his scrotum when he has episodes of vomiting.  He says he was able to push the hernia back in place at home.  He reports it is very painful when this happens.  Denies any current severe pain.   Emesis Associated symptoms: diarrhea   Diarrhea Associated symptoms: vomiting        Home Medications Prior to Admission medications   Medication Sig Start Date End Date Taking? Authorizing Provider  ondansetron (ZOFRAN) 4 MG tablet Take 1 tablet (4 mg total) by mouth every 6 (six) hours as needed for nausea or vomiting. 03/02/23  Yes Dameron, Nolberto Hanlon, DO  Meloxicam (MOBIC PO) Take by mouth as needed.    [provider]  methocarbamol (ROBAXIN) 500 MG tablet Take 1 tablet (500 mg total) by mouth 4 (four) times daily. 12/02/21   Cherie Dark, PA      Allergies    Patient has no known allergies.    Review of Systems   Review of Systems  Gastrointestinal:  Positive for diarrhea and vomiting.   A 10 point review of systems was performed and is negative unless otherwise reported in HPI.  Physical Exam Updated Vital Signs BP (!) 147/82 (BP Location: Right Arm)   Pulse 64   Temp 98.7 F (37.1 C) (Oral)   Resp 17   Ht 6\' 1"  (1.854 m)   Wt (!) 137.4 kg   SpO2 100%   BMI 39.98 kg/m  Physical Exam General: Normal  appearing male, lying in bed.  HEENT: Sclera anicteric, dry mucous membranes, trachea midline.  Cardiology: RRR, no murmurs/rubs/gallops. BL radial and DP pulses equal bilaterally.  Resp: Normal respiratory rate and effort. CTAB, no wheezes, rhonchi, crackles.  Abd/GU: No palpable bulge in left inguinal area, but area tender to palpation.  Mild diffuse abd tenderness to palpation.  Otherwise, no rebound or guarding.  No peritoneal signs. MSK: No peripheral edema or signs of trauma. Extremities without deformity or TTP. No cyanosis or clubbing. Skin: warm, dry.  Back: No CVA tenderness Neuro: A&Ox4, CNs II-XII grossly intact. MAEs. Sensation grossly intact.  Psych: Normal mood and affect.   ED Results / Procedures / Treatments   Labs (all labs ordered are listed, but only abnormal results are displayed) Labs Reviewed  COMPREHENSIVE METABOLIC PANEL - Abnormal; Notable for the following components:      Result Value   Calcium 8.8 (*)    All other components within normal limits  URINALYSIS, ROUTINE W REFLEX MICROSCOPIC - Abnormal; Notable for the following components:   Hgb urine dipstick MODERATE (*)    Bacteria, UA RARE (*)    All other components within normal limits  CK - Abnormal; Notable for the following components:   Total CK 982 (*)  All other components within normal limits  LIPASE, BLOOD  CBC  LACTIC ACID, PLASMA  CBG MONITORING, ED    EKG None  Radiology CT ABDOMEN PELVIS W CONTRAST  Result Date: 03/02/2023 CLINICAL DATA:  Nausea, vomiting, and diarrhea since Saturday after being treated for heat exhaustion. Left inguinal pain with history of hernia. EXAM: CT ABDOMEN AND PELVIS WITH CONTRAST TECHNIQUE: Multidetector CT imaging of the abdomen and pelvis was performed using the standard protocol following bolus administration of intravenous contrast. RADIATION DOSE REDUCTION: This exam was performed according to the departmental dose-optimization program which includes  automated exposure control, adjustment of the mA and/or kV according to patient size and/or use of iterative reconstruction technique. CONTRAST:  OMNIPAQUE IOHEXOL 300 MG/ML  SOLN COMPARISON:  None Available. FINDINGS: Lower chest: No acute abnormality. Hepatobiliary: No focal liver abnormality is seen. No gallstones, gallbladder wall thickening, or biliary dilatation. Pancreas: Unremarkable. No pancreatic ductal dilatation or surrounding inflammatory changes. Spleen: Normal in size without focal abnormality. Adrenals/Urinary Tract: The adrenal glands and left kidney are unremarkable. Punctate right renal calculus. No ureteral calculi or hydronephrosis. The bladder is unremarkable for the degree of distention. Stomach/Bowel: Stomach is within normal limits. Appendix appears normal. No evidence of bowel wall thickening, distention, or inflammatory changes. Vascular/Lymphatic: No significant vascular findings are present. No enlarged abdominal or pelvic lymph nodes. Reproductive: Prostate is unremarkable. Other: Moderate left inguinal hernia containing fat and nondilated sigmoid colon. No free fluid or pneumoperitoneum. Musculoskeletal: No acute or significant osseous findings. IMPRESSION: 1. No acute intra-abdominal process. 2. Moderate left inguinal hernia containing fat and non-dilated sigmoid colon. 3. Punctate nonobstructive right nephrolithiasis. Electronically Signed   By: Obie Dredge M.D.   On: 03/02/2023 13:31   US Pelvis Limited  Result Date: 03/02/2023 CLINICAL DATA:  Left groin pain, vomiting, question hernia. EXAM: LIMITED ULTRASOUND OF PELVIS TECHNIQUE: Limited transabdominal ultrasound examination of the pelvis was performed. COMPARISON:  None Available. FINDINGS: No visible left inguinal hernia, mass or fluid collection. Small non pathologically enlarged lymph nodes noted. IMPRESSION: No evidence of hernia or mass. Electronically Signed   By: Charlett Nose M.D.   On: 03/02/2023 10:53     Procedures Procedures    Medications Ordered in ED Medications  lactated ringers bolus 1,000 mL (has no administration in time range)  ondansetron (ZOFRAN) injection 4 mg (4 mg Intravenous Given 03/02/23 1005)  lactated ringers bolus 500 mL (0 mLs Intravenous Stopped 03/02/23 1115)  iohexol (OMNIPAQUE) 300 MG/ML solution 100 mL (100 mLs Intravenous Contrast Given 03/02/23 1255)    ED Course/ Medical Decision Making/ A&P                          Medical Decision Making 42 year old male with nausea vomiting diarrhea and history of inguinal hernia. Consider gastroenteritis, heat exhaustion, rhabdomyolysis, pancreatitis. No RUQ pain/biliary colic to indicate cholelithiasis/cholecystitis.  CT abdomen/pelvis without any acutely concerning findings to explain his symptoms.  He does have a small nephrolithiasis, asymptomatic from that. Limited pelvis ultrasound of inguinal area to scrotum did not show inguinal hernia, but was visualized on CT scan. No concern for incarceration or strangulation at this time as it is easily reducible, and it is reduced on my exam.  CK is mildly elevated in the 900s, and pt with hgburia and hematuria. He doesn't have any myalgias or muscle pain. Renal function is stable. Treated with LR bolus x 2L, Zofran for nausea. I considered admission for this patient but  he is able to take PO and is okay for o/p f/u.   Plan for discharge home with plan for plenty of oral hydration at home and Zofran for nausea with PCP f/u within 1 week.  Return precautions discussed should he develop dark-colored urine, intractable nausea or vomiting, incarceration of hernia. Also provided with [facility] surgery instructions for outpatient general surgery follow-up for elective repair of inguinal hernia.  Amount and/or Complexity of Data Reviewed Labs: ordered. Radiology: ordered.  Risk Prescription drug management.           This note was created using dictation  software, which may contain spelling or grammatical errors.    Darral Dash, DO 03/02/23 1445    Loetta Rough, MD 03/02/23 1459    Loetta Rough, MD 03/02/23 [phone]

## 2023-03-02 NOTE — Discharge Instructions (Addendum)
It was a pleasure caring for you today in the emergency department.  You were prescribed zofran for nausea/vomiting, you can take 4 mg every 6-8 hours to stay well hydrated.   Please call the general surgeon below to schedule an office visit for evaluation and to discuss elective surgical repair of your left inguinal hernia.  [facility] [address] [place], [place] [address] [phone]  Get plenty of rest over the next few days, drink plenty of fluids. Please follow up with your PCP in the next 3-5 days for a recheck of your symptoms and labs. Please return to the emergency department for any worsening or worrisome symptoms.

## 2023-03-18 ENCOUNTER — Ambulatory Visit (HOSPITAL_COMMUNITY)
Admission: EM | Admit: 2023-03-18 | Discharge: 2023-03-18 | Disposition: A | Discharge status: Home / Self Care | Payer: No Typology Code available for payment source

## 2023-03-18 DIAGNOSIS — Z566 Other physical and mental strain related to work: Secondary | ICD-10-CM

## 2023-03-18 DIAGNOSIS — F3289 Other specified depressive episodes: Secondary | ICD-10-CM

## 2023-03-18 DIAGNOSIS — E1169 Type 2 diabetes mellitus with other specified complication: Secondary | ICD-10-CM | POA: Diagnosis not present

## 2023-03-18 DIAGNOSIS — E6609 Other obesity due to excess calories: Secondary | ICD-10-CM | POA: Diagnosis not present

## 2023-03-18 DIAGNOSIS — E789 Disorder of lipoprotein metabolism, unspecified: Secondary | ICD-10-CM | POA: Diagnosis not present

## 2023-03-18 DIAGNOSIS — F419 Anxiety disorder, unspecified: Secondary | ICD-10-CM

## 2023-03-18 DIAGNOSIS — E559 Vitamin D deficiency, unspecified: Secondary | ICD-10-CM | POA: Diagnosis not present

## 2023-03-18 DIAGNOSIS — E669 Obesity, unspecified: Secondary | ICD-10-CM | POA: Diagnosis not present

## 2023-03-18 DIAGNOSIS — F32A Depression, unspecified: Secondary | ICD-10-CM | POA: Diagnosis not present

## 2023-03-18 DIAGNOSIS — E785 Hyperlipidemia, unspecified: Secondary | ICD-10-CM | POA: Diagnosis not present

## 2023-03-18 NOTE — Discharge Instructions (Addendum)

## 2023-03-18 NOTE — ED Provider Notes (Signed)
Behavioral Health Urgent Care Medical Screening Exam  Patient Name: [name] [name] MRN: [id number] Date of Evaluation: 03/18/23 Chief Complaint:   Diagnosis:  Final diagnoses:  Other depression  Anxiety and depression  Stress at work    History of Present illness: [name] [name] is a 42 y.o. male.wit no pertinent psychiatric history who presented voluntarily as a walk in [facility] with complaints of work related stress and depression.  Patient was seen face to face by this provider and chart reviewed.  Per Triage note "Patient reports work related stressors of being overworked, short staffed as a Psychologist, occupational. Patient reported "I have 1 million tabs in my head open and I can't close none of them". Patient reports poor sleep and normal appetite. Patient is married with 3 children. Patient denied prior psych history, suicide attempts and self-harming behaviors. Patient is not receiving any mental health services and not prescribed any psych medications. Patient contracts for safety."   On evaluation, patient is alert, oriented x 4, and cooperative. Speech is clear, normal, coherent and logical. Pt appears well groomed. Eye contact is good. Mood is anxious and depressed, affect is congruent with mood. Thought process is coherent and thought content is wdl. Pt denies SI/HI/AVH. There is no objective indication that the patient is responding to internal stimuli. No delusions elicited during this assessment.    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 Patient continues "I also lost an uncle last week and his funeral is today and also this week is the anniversary of my daughter's death, she passed away in 03-22-04, and I've been feeling depressed, poor sleep and appetite, and  I just want to lay down and not do anything, I'm also feeling anxious and stressed and I catch myself pulling at my hair sometimes, so I went to my PCP today and requested for paperwork, so I could get time off work, because I need time away from work to get my mental health together, but she told me Its has to come from a psychiatrist/behavioral health and she couldn't do it, that's why I'm here".   Patient denies a history of suicide attempts or self-harm behavior.  The patient is not established with outpatient psychiatric services.  He is not on any psychiatric medication.  Patient denies illicit substance use.  Patient lives with his wife and kids.  He denies access to a gun or weapon.  Support, encouragement and reassessment provided about ongoing stressors. Patient is provided with opportunity for questions.   Discussed recommendation for discharge and follow up with [facility] outpatient walk in clinic for psychiatric follow up.  Patient provided with work excuse note for 1 day (03/19/23) to enable him follow up with his out patient visit tomorrow.  Patient is in agreement.   Flowsheet Row ED from 03/18/2023 in [facility] ED from 03/02/2023 in [facility] Emergency Department at [facility] Admission (Discharged) from 12/02/2021 in [facility]  C-SSRS RISK CATEGORY No Risk No Risk No Risk       Psychiatric Specialty Exam  Presentation  General Appearance:Appropriate for Environment  Eye Contact:Good  Speech:Clear and Coherent  Speech Volume:Normal  Handedness:Right   Mood and Affect  Mood: Depressed; Anxious  Affect: Congruent   Thought Process  Thought Processes: Coherent  Descriptions of Associations:Intact  Orientation:Full (Time, Place and Person)  Thought Content:WDL    Hallucinations:None  Ideas of Reference:None  Suicidal Thoughts:No  Homicidal Thoughts:No   Sensorium  Memory: Immediate  Good  Judgment: Intact  Insight: Present   Executive Functions  Concentration: Good  Attention Span: Good  Recall: Good  Fund of Knowledge: Good  Language: Good   Psychomotor Activity  Psychomotor Activity: Normal   Assets  Assets: Communication Skills; Desire for Improvement; Social Support   Sleep  Sleep: Poor  Number of hours: No data recorded  Physical Exam: Physical Exam Constitutional:      General: He is not in acute distress.    Appearance: He is not diaphoretic.  HENT:     Head: Normocephalic.     Right Ear: External ear normal.     Left Ear: External ear normal.     Nose: No congestion.  Eyes:     General:        Right eye: No discharge.        Left eye: No discharge.  Cardiovascular:     Rate and Rhythm: Normal rate.  Pulmonary:     Effort: No respiratory distress.  Chest:     Chest wall: No tenderness.  Neurological:     Mental Status: He is alert and oriented to person, place, and time.  Psychiatric:        Attention and Perception: Attention and perception normal.        Mood and Affect: Mood is anxious and depressed.        Speech: Speech normal.        Behavior: Behavior is cooperative.        Thought Content: Thought content normal. Thought content is not paranoid or delusional. Thought content does not include homicidal or suicidal ideation. Thought content does not include homicidal or suicidal plan.        Cognition and Memory: Cognition and memory normal.        Judgment: Judgment normal.   Review of Systems  Constitutional:  Negative for chills, diaphoresis and fever.  HENT:  Negative for congestion.   Eyes:  Negative for discharge.  Respiratory:  Negative for cough, shortness of breath and wheezing.   Cardiovascular:  Negative for chest pain and palpitations.  Gastrointestinal:  Negative for diarrhea, nausea and vomiting.  Neurological:  Negative for dizziness, seizures, loss of consciousness, weakness and  headaches.  Psychiatric/Behavioral:  Positive for depression. The patient is nervous/anxious.    Blood pressure (!) 146/88, pulse 90, temperature 97.8 F (36.6 C), temperature source Oral, resp. rate 18, SpO2 99%. There is no height or weight on file to calculate BMI.  Musculoskeletal: Strength & Muscle Tone: within normal limits Gait & Station: normal Patient leans: N/A   [facility] MSE Discharge Disposition for Follow up and Recommendations: Based on my evaluation the patient does not appear to have an emergency medical condition and can be discharged with resources and follow up care in outpatient services for Medication Management and Individual Therapy  Patient discharged in stable condition with resources.  Discharge recommendations:  Please see information for follow-up appointment with psychiatry and therapy. Please follow up with your primary care provider for all medical related needs.   Therapy: We recommend that patient participate in individual therapy to address mental health concerns.  Medications: The patient or guardian is to contact a medical professional and/or outpatient provider to address any  new side effects that develop. The patient or guardian should update outpatient providers of any new medications and/or medication changes.   Safety:  The patient should abstain from use of illicit substances/drugs and abuse of any medications. If symptoms worsen or do not continue to improve or if the patient becomes actively suicidal or homicidal then it is recommended that the patient return to the closest hospital emergency department, the [facility], or call 911 for further evaluation and treatment. National Suicide Prevention Lifeline 1-800-SUICIDE or 5088245298.  About 988 988 offers 24/7 access to trained crisis counselors who can help people experiencing mental health-related distress. People can call or text 988 or chat 988lifeline.org  for themselves or if they are worried about a loved one who may need crisis support.  Crisis Mobile: [facility]:                     (774) 725-8945 (for crisis response 24 hours a day) [facility] Hotline:                                            239-227-9409    Mancel Bale, NP 03/18/2023, 9:55 PM

## 2023-03-18 NOTE — ED Notes (Signed)
Pt was given d/c instructions and information for walk in clinic. He was also given a work note to return on Saturday 03/20/2023

## 2023-03-18 NOTE — Progress Notes (Signed)
   03/18/23 2120  [facility] Triage Screening (Walk-ins at [facility] only)  How Did You Hear About [place]? Self  What Is the Reason for Your Visit/Call Today? [name] [name] is a 42 year old male presenting as a voluntary walk-in to [facility] due to stress management and depression. Patient denied SI, HI, psychosis and alcohol/drug usage. Patient reports work related stressors of being overworked, short staffed as a Psychologist, occupational. Patient reported "I have 1 million tabs in my head open and I can't close none of them". Patient reports poor sleep and normal appetite. Patient is married with 3 children. Patient denied prior psych history, suicide attempts and self-harming behaviors. Patient is not receiving any mental health services and not prescribed any psych medications. Patient contracts for safety.  How Long Has This Been Causing You Problems? 1 wk - 1 month  Have You Recently Had Any Thoughts About Hurting Yourself? No  Are You Planning to Commit Suicide/Harm Yourself At This time? No  Have you Recently Had Thoughts About Hurting Someone [name] [name]? No  Are You Planning To Harm Someone At This Time? No  Are you currently experiencing any auditory, visual or other hallucinations? No  Have You Used Any Alcohol or Drugs in the Past 24 Hours? No  Do you have any current medical co-morbidities that require immediate attention? No  Clinician description of patient physical appearance/behavior: neat / cooperative  What Do You Feel Would Help You the Most Today? Stress Management;Treatment for Depression or other mood problem  If access to [facility] Ur[facility] Care was not available, would you have sought care in the Emergency Department? No  Determination of Need Routine (7 days)  Options For Referral Outpatient Therapy;Medication Management    Flowsheet Row ED from 03/18/2023 in [facility] ED from 03/02/2023 in [facility] Emergency Department at [facility] Admission (Discharged) from 12/02/2021 in  [facility]  C-SSRS RISK CATEGORY No Risk No Risk No Risk

## 2023-03-22 DIAGNOSIS — F329 Major depressive disorder, single episode, unspecified: Secondary | ICD-10-CM | POA: Diagnosis not present

## 2023-03-22 DIAGNOSIS — E1169 Type 2 diabetes mellitus with other specified complication: Secondary | ICD-10-CM | POA: Diagnosis not present

## 2023-03-22 DIAGNOSIS — E785 Hyperlipidemia, unspecified: Secondary | ICD-10-CM | POA: Diagnosis not present

## 2023-03-22 DIAGNOSIS — E559 Vitamin D deficiency, unspecified: Secondary | ICD-10-CM | POA: Diagnosis not present

## 2023-03-22 DIAGNOSIS — K4090 Unilateral inguinal hernia, without obstruction or gangrene, not specified as recurrent: Secondary | ICD-10-CM | POA: Diagnosis not present

## 2023-03-22 DIAGNOSIS — R030 Elevated blood-pressure reading, without diagnosis of hypertension: Secondary | ICD-10-CM | POA: Diagnosis not present

## 2023-03-22 DIAGNOSIS — F17210 Nicotine dependence, cigarettes, uncomplicated: Secondary | ICD-10-CM | POA: Diagnosis not present

## 2023-03-22 DIAGNOSIS — E789 Disorder of lipoprotein metabolism, unspecified: Secondary | ICD-10-CM | POA: Diagnosis not present

## 2023-03-22 DIAGNOSIS — E6609 Other obesity due to excess calories: Secondary | ICD-10-CM | POA: Diagnosis not present

## 2023-03-22 DIAGNOSIS — Z6839 Body mass index (BMI) 39.0-39.9, adult: Secondary | ICD-10-CM | POA: Diagnosis not present

## 2023-03-29 DIAGNOSIS — F17210 Nicotine dependence, cigarettes, uncomplicated: Secondary | ICD-10-CM | POA: Diagnosis not present

## 2023-03-29 DIAGNOSIS — R030 Elevated blood-pressure reading, without diagnosis of hypertension: Secondary | ICD-10-CM | POA: Diagnosis not present

## 2023-03-29 DIAGNOSIS — K4090 Unilateral inguinal hernia, without obstruction or gangrene, not specified as recurrent: Secondary | ICD-10-CM | POA: Diagnosis not present

## 2023-03-29 DIAGNOSIS — E6609 Other obesity due to excess calories: Secondary | ICD-10-CM | POA: Diagnosis not present

## 2023-03-29 DIAGNOSIS — E1169 Type 2 diabetes mellitus with other specified complication: Secondary | ICD-10-CM | POA: Diagnosis not present

## 2023-03-29 DIAGNOSIS — Z6839 Body mass index (BMI) 39.0-39.9, adult: Secondary | ICD-10-CM | POA: Diagnosis not present

## 2023-03-29 DIAGNOSIS — E785 Hyperlipidemia, unspecified: Secondary | ICD-10-CM | POA: Diagnosis not present

## 2023-03-29 DIAGNOSIS — E789 Disorder of lipoprotein metabolism, unspecified: Secondary | ICD-10-CM | POA: Diagnosis not present

## 2023-03-29 DIAGNOSIS — F329 Major depressive disorder, single episode, unspecified: Secondary | ICD-10-CM | POA: Diagnosis not present

## 2023-03-29 DIAGNOSIS — E559 Vitamin D deficiency, unspecified: Secondary | ICD-10-CM | POA: Diagnosis not present

## 2023-04-08 DIAGNOSIS — F331 Major depressive disorder, recurrent, moderate: Secondary | ICD-10-CM | POA: Diagnosis not present

## 2023-04-08 DIAGNOSIS — F17210 Nicotine dependence, cigarettes, uncomplicated: Secondary | ICD-10-CM | POA: Diagnosis not present

## 2023-04-08 DIAGNOSIS — R030 Elevated blood-pressure reading, without diagnosis of hypertension: Secondary | ICD-10-CM | POA: Diagnosis not present

## 2023-04-08 DIAGNOSIS — E559 Vitamin D deficiency, unspecified: Secondary | ICD-10-CM | POA: Diagnosis not present

## 2023-04-08 DIAGNOSIS — E6609 Other obesity due to excess calories: Secondary | ICD-10-CM | POA: Diagnosis not present

## 2023-04-08 DIAGNOSIS — E785 Hyperlipidemia, unspecified: Secondary | ICD-10-CM | POA: Diagnosis not present

## 2023-04-08 DIAGNOSIS — F411 Generalized anxiety disorder: Secondary | ICD-10-CM | POA: Diagnosis not present

## 2023-04-08 DIAGNOSIS — F4310 Post-traumatic stress disorder, unspecified: Secondary | ICD-10-CM | POA: Diagnosis not present

## 2023-04-08 DIAGNOSIS — E789 Disorder of lipoprotein metabolism, unspecified: Secondary | ICD-10-CM | POA: Diagnosis not present

## 2023-04-08 DIAGNOSIS — Z6839 Body mass index (BMI) 39.0-39.9, adult: Secondary | ICD-10-CM | POA: Diagnosis not present

## 2023-04-08 DIAGNOSIS — E1169 Type 2 diabetes mellitus with other specified complication: Secondary | ICD-10-CM | POA: Diagnosis not present

## 2023-05-03 DIAGNOSIS — F909 Attention-deficit hyperactivity disorder, unspecified type: Secondary | ICD-10-CM | POA: Diagnosis not present

## 2023-05-03 DIAGNOSIS — Z79899 Other long term (current) drug therapy: Secondary | ICD-10-CM | POA: Diagnosis not present

## 2023-05-03 DIAGNOSIS — F17210 Nicotine dependence, cigarettes, uncomplicated: Secondary | ICD-10-CM | POA: Diagnosis not present

## 2023-05-03 DIAGNOSIS — F411 Generalized anxiety disorder: Secondary | ICD-10-CM | POA: Diagnosis not present

## 2023-05-03 DIAGNOSIS — E559 Vitamin D deficiency, unspecified: Secondary | ICD-10-CM | POA: Diagnosis not present

## 2023-05-03 DIAGNOSIS — Z1339 Encounter for screening examination for other mental health and behavioral disorders: Secondary | ICD-10-CM | POA: Diagnosis not present

## 2023-05-03 DIAGNOSIS — F4310 Post-traumatic stress disorder, unspecified: Secondary | ICD-10-CM | POA: Diagnosis not present

## 2023-05-03 DIAGNOSIS — E6609 Other obesity due to excess calories: Secondary | ICD-10-CM | POA: Diagnosis not present

## 2023-05-03 DIAGNOSIS — F331 Major depressive disorder, recurrent, moderate: Secondary | ICD-10-CM | POA: Diagnosis not present

## 2023-05-03 DIAGNOSIS — Z6839 Body mass index (BMI) 39.0-39.9, adult: Secondary | ICD-10-CM | POA: Diagnosis not present

## 2023-05-11 DIAGNOSIS — E6609 Other obesity due to excess calories: Secondary | ICD-10-CM | POA: Diagnosis not present

## 2023-05-11 DIAGNOSIS — F329 Major depressive disorder, single episode, unspecified: Secondary | ICD-10-CM | POA: Diagnosis not present

## 2023-05-11 DIAGNOSIS — R030 Elevated blood-pressure reading, without diagnosis of hypertension: Secondary | ICD-10-CM | POA: Diagnosis not present

## 2023-05-11 DIAGNOSIS — Z6838 Body mass index (BMI) 38.0-38.9, adult: Secondary | ICD-10-CM | POA: Diagnosis not present

## 2023-05-11 DIAGNOSIS — E789 Disorder of lipoprotein metabolism, unspecified: Secondary | ICD-10-CM | POA: Diagnosis not present

## 2023-05-11 DIAGNOSIS — F17210 Nicotine dependence, cigarettes, uncomplicated: Secondary | ICD-10-CM | POA: Diagnosis not present

## 2023-05-11 DIAGNOSIS — E559 Vitamin D deficiency, unspecified: Secondary | ICD-10-CM | POA: Diagnosis not present

## 2023-05-11 DIAGNOSIS — E785 Hyperlipidemia, unspecified: Secondary | ICD-10-CM | POA: Diagnosis not present

## 2023-05-11 DIAGNOSIS — E1169 Type 2 diabetes mellitus with other specified complication: Secondary | ICD-10-CM | POA: Diagnosis not present

## 2023-05-13 DIAGNOSIS — Z1339 Encounter for screening examination for other mental health and behavioral disorders: Secondary | ICD-10-CM | POA: Diagnosis not present

## 2023-05-13 DIAGNOSIS — Z6839 Body mass index (BMI) 39.0-39.9, adult: Secondary | ICD-10-CM | POA: Diagnosis not present

## 2023-05-13 DIAGNOSIS — F411 Generalized anxiety disorder: Secondary | ICD-10-CM | POA: Diagnosis not present

## 2023-05-13 DIAGNOSIS — F909 Attention-deficit hyperactivity disorder, unspecified type: Secondary | ICD-10-CM | POA: Diagnosis not present

## 2023-05-13 DIAGNOSIS — R030 Elevated blood-pressure reading, without diagnosis of hypertension: Secondary | ICD-10-CM | POA: Diagnosis not present

## 2023-05-13 DIAGNOSIS — E6609 Other obesity due to excess calories: Secondary | ICD-10-CM | POA: Diagnosis not present

## 2023-05-13 DIAGNOSIS — F4310 Post-traumatic stress disorder, unspecified: Secondary | ICD-10-CM | POA: Diagnosis not present

## 2023-05-13 DIAGNOSIS — E559 Vitamin D deficiency, unspecified: Secondary | ICD-10-CM | POA: Diagnosis not present

## 2023-05-13 DIAGNOSIS — F331 Major depressive disorder, recurrent, moderate: Secondary | ICD-10-CM | POA: Diagnosis not present

## 2023-08-05 ENCOUNTER — Emergency Department (HOSPITAL_COMMUNITY)
Admission: EM | Admit: 2023-08-05 | Discharge: 2023-08-06 | Disposition: A | Discharge status: Home / Self Care | Payer: 59 | Attending: Emergency Medicine | Admitting: Emergency Medicine

## 2023-08-05 ENCOUNTER — Encounter (HOSPITAL_COMMUNITY): Payer: Self-pay

## 2023-08-05 DIAGNOSIS — K4020 Bilateral inguinal hernia, without obstruction or gangrene, not specified as recurrent: Secondary | ICD-10-CM | POA: Diagnosis not present

## 2023-08-05 DIAGNOSIS — K4090 Unilateral inguinal hernia, without obstruction or gangrene, not specified as recurrent: Secondary | ICD-10-CM | POA: Insufficient documentation

## 2023-08-05 DIAGNOSIS — R109 Unspecified abdominal pain: Secondary | ICD-10-CM | POA: Diagnosis not present

## 2023-08-05 LAB — URINALYSIS, ROUTINE W REFLEX MICROSCOPIC
Bacteria, UA: NONE SEEN
Bilirubin Urine: NEGATIVE
Glucose, UA: NEGATIVE mg/dL
Ketones, ur: NEGATIVE mg/dL
Leukocytes,Ua: NEGATIVE
Nitrite: NEGATIVE
Protein, ur: NEGATIVE mg/dL
Specific Gravity, Urine: 1.01 (ref 1.005–1.030)
pH: 6 (ref 5.0–8.0)

## 2023-08-05 LAB — CBC
HCT: 41.7 % (ref 39.0–52.0)
Hemoglobin: 13.6 g/dL (ref 13.0–17.0)
MCH: 27.5 pg (ref 26.0–34.0)
MCHC: 32.6 g/dL (ref 30.0–36.0)
MCV: 84.2 fL (ref 80.0–100.0)
Platelets: 287 10*3/uL (ref 150–400)
RBC: 4.95 MIL/uL (ref 4.22–5.81)
RDW: 14.5 % (ref 11.5–15.5)
WBC: 7.4 10*3/uL (ref 4.0–10.5)
nRBC: 0 % (ref 0.0–0.2)

## 2023-08-05 LAB — COMPREHENSIVE METABOLIC PANEL
ALT: 42 U/L (ref 0–44)
AST: 29 U/L (ref 15–41)
Albumin: 3.8 g/dL (ref 3.5–5.0)
Alkaline Phosphatase: 67 U/L (ref 38–126)
Anion gap: 8 (ref 5–15)
BUN: 9 mg/dL (ref 6–20)
CO2: 24 mmol/L (ref 22–32)
Calcium: 9.3 mg/dL (ref 8.9–10.3)
Chloride: 108 mmol/L (ref 98–111)
Creatinine, Ser: 1.16 mg/dL (ref 0.61–1.24)
GFR, Estimated: >60 mL/min (ref >60)
Glucose, Bld: 135 mg/dL — ABNORMAL HIGH (ref 70–99)
Potassium: 3.9 mmol/L (ref 3.5–5.1)
Sodium: 140 mmol/L (ref 135–145)
Total Bilirubin: 0.5 mg/dL (ref <1.2)
Total Protein: 7.2 g/dL (ref 6.5–8.1)

## 2023-08-05 MED ORDER — ONDANSETRON HCL 4 MG/2ML IJ SOLN
4.0000 mg | Freq: Once | INTRAMUSCULAR | Status: AC
  Administered 2023-08-05: 4 mg via INTRAVENOUS
  Filled 2023-08-05: qty 2

## 2023-08-05 MED ORDER — MORPHINE SULFATE (PF) 4 MG/ML IV SOLN
4.0000 mg | Freq: Once | INTRAVENOUS | Status: AC
  Administered 2023-08-05: 4 mg via INTRAVENOUS
  Filled 2023-08-05: qty 1

## 2023-08-05 NOTE — ED Provider Notes (Signed)
[place] EMERGENCY DEPARTMENT AT [facility] Provider Note   CSN: [id number] Arrival date & time: 08/05/23  2220     History  Chief Complaint  Patient presents with   Inguinal Hernia    [name] [name] is a 42 y.o. male.  The history is provided by the patient and medical records.   42 year old male with history of sleep apnea, presenting to the ED for hernia.  States had left inguinal hernia repair w/mesh a few years ago in [place].  Over the past several weeks he feels like hernia has recurred, increased pain over the past 2 days or so.  He reports does not feel like hernia reduces now (it did previously) and it is very painful to the touch.  He also has had some difficulty with BM's.  Urination is normal.  Some nausea without vomiting.  Did eat/drink today as normal.  No other abdominal surgeries.  Home Medications Prior to Admission medications   Medication Sig Start Date End Date Taking? Authorizing Provider  Meloxicam (MOBIC PO) Take by mouth as needed.    [provider]  methocarbamol (ROBAXIN) 500 MG tablet Take 1 tablet (500 mg total) by mouth 4 (four) times daily. 12/02/21   Haus, Pearletha Furl, PA  ondansetron (ZOFRAN) 4 MG tablet Take 1 tablet (4 mg total) by mouth every 6 (six) hours as needed for nausea or vomiting. 03/02/23   Loetta Rough, MD      Allergies    Patient has no known allergies.    Review of Systems   Review of Systems  Gastrointestinal:  Positive for abdominal pain.  All other systems reviewed and are negative.   Physical Exam Updated Vital Signs BP (!) 141/75   Pulse 92   Temp 99.4 F (37.4 C)   Resp 18   SpO2 97%   Physical Exam Vitals and nursing note reviewed.  Constitutional:      Appearance: He is well-developed.  HENT:     Head: Normocephalic and atraumatic.  Eyes:     Conjunctiva/sclera: Conjunctivae normal.     Pupils: Pupils are equal, round, and reactive to light.  Cardiovascular:     Rate and  Rhythm: Normal rate and regular rhythm.     Heart sounds: Normal heart sounds.  Pulmonary:     Effort: Pulmonary effort is normal.     Breath sounds: Normal breath sounds.  Abdominal:     General: Bowel sounds are normal.     Palpations: Abdomen is soft.  Genitourinary:    Comments: Left inguinal hernia noted, tender to palpation Musculoskeletal:        General: Normal range of motion.     Cervical back: Normal range of motion.  Skin:    General: Skin is warm and dry.  Neurological:     Mental Status: He is alert and oriented to person, place, and time.     ED Results / Procedures / Treatments   Labs (all labs ordered are listed, but only abnormal results are displayed) Labs Reviewed  COMPREHENSIVE METABOLIC PANEL - Abnormal; Notable for the following components:      Result Value   Glucose, Bld 135 (*)    All other components within normal limits  URINALYSIS, ROUTINE W REFLEX MICROSCOPIC - Abnormal; Notable for the following components:   Color, Urine STRAW (*)    Hgb urine dipstick SMALL (*)    All other components within normal limits  CBC    EKG None  Radiology  CT ABDOMEN PELVIS W CONTRAST  Result Date: 08/06/2023 CLINICAL DATA:  Left inguinal hernia, abdominal pain EXAM: CT ABDOMEN AND PELVIS WITH CONTRAST TECHNIQUE: Multidetector CT imaging of the abdomen and pelvis was performed using the standard protocol following bolus administration of intravenous contrast. RADIATION DOSE REDUCTION: This exam was performed according to the departmental dose-optimization program which includes automated exposure control, adjustment of the mA and/or kV according to patient size and/or use of iterative reconstruction technique. CONTRAST:  OMNIPAQUE IOHEXOL 350 MG/ML SOLN COMPARISON:  03/02/2023 FINDINGS: Lower chest: No acute abnormality. Hepatobiliary: No focal liver abnormality is seen. No gallstones, gallbladder wall thickening, or biliary dilatation. Pancreas: Unremarkable  Spleen: Unremarkable Adrenals/Urinary Tract: Adrenal glands are unremarkable. Kidneys are normal, without renal calculi, focal lesion, or hydronephrosis. Bladder is unremarkable. Stomach/Bowel: Moderate left inguinal hernia contains and unremarkable loop proximal sigmoid colon. The stomach, small bowel, and large bowel are otherwise unremarkable. Appendix normal. No free intraperitoneal gas or fluid. Vascular/Lymphatic: No significant vascular findings are present. No enlarged abdominal or pelvic lymph nodes. Reproductive: Prostate is unremarkable. Other: Tiny fat containing right inguinal hernia Musculoskeletal: No acute or significant osseous findings. IMPRESSION: 1. No acute intra-abdominal pathology identified. 2. Moderate left inguinal hernia containing an unremarkable loop of proximal sigmoid colon. 3. Tiny fat containing right inguinal hernia Electronically Signed   By: Helyn Numbers M.D.   On: 08/06/2023 01:05    Procedures Procedures    Medications Ordered in ED Medications  morphine (PF) 4 MG/ML injection 4 mg (4 mg Intravenous Given 08/05/23 2359)  ondansetron (ZOFRAN) injection 4 mg (4 mg Intravenous Given 08/05/23 2359)  iohexol (OMNIPAQUE) 350 MG/ML injection 100 mL (100 mLs Intravenous Contrast Given 08/06/23 0049)    ED Course/ Medical Decision Making/ A&P                                 Medical Decision Making Amount and/or Complexity of Data Reviewed Labs: ordered. Radiology: ordered and independent interpretation performed. ECG/medicine tests: ordered and independent interpretation performed.  Risk Prescription drug management.   42 year old male presenting to the ED with left inguinal hernia pain.  Had repair few years ago but has had recurrent hernia over the past several weeks.  The past 2 days has had increased pain and does not feel area fully reduces.  He is afebrile and nontoxic.  Does have left inguinal hernia palpable on exam but no overlying skin changes.   There is some mild tenderness.  Labs without leukocytosis or electrolyte derangement.  UA without any signs of infection.  CT with left inguinal hernia, does appear to obtain some bowel, but no evidence of incarceration or strangulation.  Patient is feeling better after some pain medication here.  Will refer to general surgery for follow-up as he may need a revision of his repair.  Short course pain meds given and work note for today.  Return here for new concerns.  Final Clinical Impression(s) / ED Diagnoses Final diagnoses:  Left inguinal hernia    Rx / DC Orders ED Discharge Orders          Ordered    oxyCODONE-acetaminophen (PERCOCET) 5-325 MG tablet  Every 4 hours PRN        08/06/23 0116              Garlon Hatchet, PA-C 08/06/23 0214    Mesner, Barbara Cower, MD 08/07/23 [phone]

## 2023-08-05 NOTE — ED Triage Notes (Signed)
Pt has had a previous hernia repair on the left side, it was a mesh repair he states over the last few weeks it has been popping out and he usually can push it back in, but the past few days but more so today he has had a lot more pain when pushing it back in and it is more painful than usual. He expresses no other complaints at this time and is otherwise stable.

## 2023-08-06 ENCOUNTER — Emergency Department (HOSPITAL_COMMUNITY): Payer: 59

## 2023-08-06 DIAGNOSIS — R109 Unspecified abdominal pain: Secondary | ICD-10-CM | POA: Diagnosis not present

## 2023-08-06 DIAGNOSIS — K4020 Bilateral inguinal hernia, without obstruction or gangrene, not specified as recurrent: Secondary | ICD-10-CM | POA: Diagnosis not present

## 2023-08-06 MED ORDER — OXYCODONE-ACETAMINOPHEN 5-325 MG PO TABS: 1.0000 | ORAL_TABLET | ORAL | 0 refills | Status: AC | PRN

## 2023-08-06 MED ORDER — IOHEXOL 350 MG/ML SOLN
100.0000 mL | Freq: Once | INTRAVENOUS | Status: AC | PRN
  Administered 2023-08-06: 100 mL via INTRAVENOUS

## 2023-08-06 NOTE — ED Notes (Signed)
Patient verbalizes understanding of discharge instructions. Opportunity for questioning and answers were provided. Armband removed by staff, pt discharged from ED. Ambulated out to lobby with wife  

## 2023-08-06 NOTE — Discharge Instructions (Signed)
Take the prescribed medication as directed. Follow-up with general surgery-- I would call in the morning to try and get appt scheduled. Return to the ED for new or worsening symptoms.

## 2023-08-13 DIAGNOSIS — K4091 Unilateral inguinal hernia, without obstruction or gangrene, recurrent: Secondary | ICD-10-CM | POA: Diagnosis not present
